# Patient Record
Sex: Female | Born: 1955 | Race: White | Hispanic: No | Marital: Married | Smoking: Never smoker
Health system: Southern US, Community
[De-identification: ages and names within clinical notes are randomized; demographics above are authoritative.]

## PROBLEM LIST (undated history)

## (undated) DIAGNOSIS — H269 Unspecified cataract: Secondary | ICD-10-CM

## (undated) DIAGNOSIS — M7542 Impingement syndrome of left shoulder: Secondary | ICD-10-CM

## (undated) DIAGNOSIS — K644 Residual hemorrhoidal skin tags: Secondary | ICD-10-CM

## (undated) DIAGNOSIS — H8110 Benign paroxysmal vertigo, unspecified ear: Secondary | ICD-10-CM

## (undated) HISTORY — DX: Benign paroxysmal vertigo, unspecified ear: H81.10

## (undated) HISTORY — PX: NO PAST SURGERIES: SHX2092

## (undated) HISTORY — DX: Unspecified cataract: H26.9

---
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
## 2016-08-29 ENCOUNTER — Ambulatory Visit (INDEPENDENT_AMBULATORY_CARE_PROVIDER_SITE_OTHER): Payer: Managed Care, Other (non HMO) | Admitting: Family Medicine

## 2016-08-29 VITALS — BP 122/80 | HR 70 | Temp 97.8°F | Resp 16 | Ht 66.0 in | Wt 137.0 lb

## 2016-08-29 DIAGNOSIS — Z1211 Encounter for screening for malignant neoplasm of colon: Secondary | ICD-10-CM | POA: Diagnosis not present

## 2016-08-29 DIAGNOSIS — K649 Unspecified hemorrhoids: Secondary | ICD-10-CM

## 2016-08-29 DIAGNOSIS — Z136 Encounter for screening for cardiovascular disorders: Secondary | ICD-10-CM

## 2016-08-29 DIAGNOSIS — Z124 Encounter for screening for malignant neoplasm of cervix: Secondary | ICD-10-CM

## 2016-08-29 DIAGNOSIS — Z0000 Encounter for general adult medical examination without abnormal findings: Secondary | ICD-10-CM | POA: Diagnosis not present

## 2016-08-29 DIAGNOSIS — Z1389 Encounter for screening for other disorder: Secondary | ICD-10-CM | POA: Diagnosis not present

## 2016-08-29 DIAGNOSIS — Z1383 Encounter for screening for respiratory disorder NEC: Secondary | ICD-10-CM | POA: Diagnosis not present

## 2016-08-29 DIAGNOSIS — M2550 Pain in unspecified joint: Secondary | ICD-10-CM | POA: Diagnosis not present

## 2016-08-29 DIAGNOSIS — Z1212 Encounter for screening for malignant neoplasm of rectum: Secondary | ICD-10-CM

## 2016-08-29 DIAGNOSIS — Z1329 Encounter for screening for other suspected endocrine disorder: Secondary | ICD-10-CM

## 2016-08-29 DIAGNOSIS — Z113 Encounter for screening for infections with a predominantly sexual mode of transmission: Secondary | ICD-10-CM

## 2016-08-29 DIAGNOSIS — Z130 Encounter for screening for diseases of the blood and blood-forming organs and certain disorders involving the immune mechanism: Secondary | ICD-10-CM | POA: Diagnosis not present

## 2016-08-29 LAB — POCT URINALYSIS DIP (MANUAL ENTRY)
BILIRUBIN UA: NEGATIVE
Bilirubin, UA: NEGATIVE
Glucose, UA: NEGATIVE
Leukocytes, UA: NEGATIVE
Nitrite, UA: NEGATIVE
PH UA: 7.5
PROTEIN UA: NEGATIVE
SPEC GRAV UA: 1.01
Urobilinogen, UA: 0.2

## 2016-08-29 LAB — COMPREHENSIVE METABOLIC PANEL
ALBUMIN: 4.5 g/dL (ref 3.6–5.1)
ALT: 16 U/L (ref 6–29)
AST: 22 U/L (ref 10–35)
Alkaline Phosphatase: 70 U/L (ref 33–130)
BUN: 9 mg/dL (ref 7–25)
CHLORIDE: 102 mmol/L (ref 98–110)
CO2: 29 mmol/L (ref 20–31)
CREATININE: 0.73 mg/dL (ref 0.50–0.99)
Calcium: 9.8 mg/dL (ref 8.6–10.4)
Glucose, Bld: 86 mg/dL (ref 65–99)
Potassium: 5.4 mmol/L — ABNORMAL HIGH (ref 3.5–5.3)
SODIUM: 142 mmol/L (ref 135–146)
Total Bilirubin: 0.6 mg/dL (ref 0.2–1.2)
Total Protein: 6.9 g/dL (ref 6.1–8.1)

## 2016-08-29 LAB — CBC
HCT: 44.2 % (ref 35.0–45.0)
HEMOGLOBIN: 14.8 g/dL (ref 11.7–15.5)
MCH: 30.7 pg (ref 27.0–33.0)
MCHC: 33.5 g/dL (ref 32.0–36.0)
MCV: 91.7 fL (ref 80.0–100.0)
MPV: 10 fL (ref 7.5–12.5)
PLATELETS: 232 10*3/uL (ref 140–400)
RBC: 4.82 MIL/uL (ref 3.80–5.10)
RDW: 13.6 % (ref 11.0–15.0)
WBC: 4.7 10*3/uL (ref 3.8–10.8)

## 2016-08-29 LAB — LIPID PANEL
CHOL/HDL RATIO: 2.9 ratio (ref <=5.0)
CHOLESTEROL: 208 mg/dL — AB (ref 125–200)
HDL: 72 mg/dL (ref >=46)
LDL Cholesterol: 119 mg/dL (ref <130)
Triglycerides: 83 mg/dL (ref <150)
VLDL: 17 mg/dL (ref <30)

## 2016-08-29 LAB — TSH: TSH: 1.73 mIU/L

## 2016-08-29 LAB — C-REACTIVE PROTEIN: CRP: 0.7 mg/L (ref <8.0)

## 2016-08-29 LAB — SEDIMENTATION RATE: Sed Rate: 4 mm/hr (ref 0–30)

## 2016-08-29 LAB — HEPATITIS C ANTIBODY: HCV Ab: NEGATIVE

## 2016-08-29 NOTE — Progress Notes (Addendum)
Subjective:  This chart was scribed for Shawna SorensonEva Zakk Borgen MD, by Veverly FellsHatice Demirci,scribe, at [facility].  This patient was seen in room 9 and the patient's care was started at 8:59 AM.   Chief Complaint  Patient presents with  . Annual Exam    CPE      Patient ID: Shawna Bass, female    DOB: 09-05-1956, 60 y.o.   MRN: [id number]  HPI HPI Comments: Shawna RubensteinLaurey Mittman is a 60 y.o. female who presents to the [facility] for an annual physical exam.  She denies any dietary restrictions. Patient walks for exercise.  Pt does not have a PCP and is considering establishing her on the recommendation of my mother.  I had the pleasure of meeting Ms. Carol's daughter Shawna Bass last mo when she came to see me for worsening atypical headaches and syncope.   Hemorrhoids: She is concerned regarding her hemorrhoids and would like information on what she should do without having to go through surgery. 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  Patient states that she was told by a different physican who she saw mos later for a second opinion (regarding her internal hemorrhoids) that she did not in fact have them.  She has been using Miralax intermittently for the last 10 years. She "rarely" has bleeding and pain with her bowel movements.  CRS: Patients last colonoscopy was 10 years ago completed by Dr. Rise Paganiniharles Parker in [place].    Breast Cancer Screen: She had a mammogram completed last month 07/2016 in [place] and is compliant with being screened annually.  She has no family history of breast cancer.   Cervical Cancer Screen: Patient had her last pap smear 5 years ago and had an abnormal one 30 years ago which was normal on recheck and never required colposcopy or biopsy per her knowledge. All have been normal since.  She denies having a hysterectomy.  Patient would not like an STD screening today as she is in a long-term  married monogamous relationship.    Bone Health: Patient alternates a Vitamin D (800 IU) supp with a calcium supplement daily and eats two yogurts daily.  She has no family history of osteoporosis.  Lt Knee: Patient was having intermittent left knee pain in the past and was [facility] and had an MRI two years ago which came back showing osteoarthritis that was "normal for her age".   Lt Shoulder: Three years ago, she started having left shoulder pain which worsened over the past year and then again a couple months ago after a fall on her back resulting in a lumbar hematoma. At that time, she had also injured her back which slowly healed over time.   [facility] where she had a steroid injection in her shoulder. Since then, she has slightly "felt better".  She states that she has been coping with her knee and shoulder and states that the pain is "not that bad".  She does have times where her pain increases while lifting her luggage's during travel.   Lt Hip: 4 months ago, she started having left hip pain which was worsening and kept her from walking with ease or going up the stairs.  She tried acupuncture treatments for all of her joint pains and "took it easy" with her activities which seemed to help. Patient states that she has been having these various joint pains "  mainly on the left side" but they always seem to resolve on their own.  She has been resistant to trying nsaids as does not want to take medication unless absolutely necessary but she also doesn't want to make the joint pain worse but not getting things appropriately treated.   Past Medical History:  Diagnosis Date  . Cataract     No current outpatient prescriptions on file prior to visit.   No current facility-administered medications on file prior to visit.     Allergies  Allergen Reactions  . Sulfur     No past surgical history on file. Family History  Problem Relation Age of Onset  .  Stroke Mother   . Heart disease Father    Social History   Social History  . Marital status: Married    Spouse name: N/A  . Number of children: N/A  . Years of education: N/A   Social History Main Topics  . Smoking status: Never Smoker  . Smokeless tobacco: Never Used  . Alcohol use 6.0 oz/week    10 Standard drinks or equivalent per week  . Drug use: No  . Sexual activity: Not Asked   Other Topics Concern  . None   Social History Narrative  . None   Depression screen [facility] 2/9 08/29/2016  Decreased Interest 0  Down, Depressed, Hopeless 0  PHQ - 2 Score 0     Review of Systems  Constitutional: Negative for chills and fever.  Eyes: Negative for pain, redness and itching.  Gastrointestinal: Negative for abdominal distention, abdominal pain, nausea and vomiting.  Musculoskeletal: Positive for arthralgias and myalgias. Negative for neck pain and neck stiffness.  All other systems reviewed and are negative.      Objective:   Physical Exam  Constitutional: She is oriented to person, place, and time. She appears well-developed and well-nourished. No distress.  HENT:  Head: Normocephalic and atraumatic.  Right Ear: Tympanic membrane and external ear normal.  Left Ear: Tympanic membrane and external ear normal.  Nose: Nose normal.  Mouth/Throat: Oropharynx is clear and moist. No oropharyngeal exudate.  Neck: Normal range of motion. No thyromegaly present.  Cardiovascular: Normal rate, regular rhythm, S1 normal, S2 normal and normal heart sounds.  Exam reveals no gallop and no friction rub.   No murmur heard. 2+ pedal pulses.   Pulmonary/Chest: Effort normal and breath sounds normal. No respiratory distress.  Good air movement.  Breast exam: normal exam, no axillary adenopathy.    Abdominal: Soft. Normal appearance and bowel sounds are normal. She exhibits no distension. There is no hepatosplenomegaly. There is no tenderness. There is no rebound, no guarding and no CVA  tenderness.  Genitourinary:  Genitourinary Comments: She had a tiny urethral cyst (2 mm), she had approximately 1 non inflames external hemorrhoid and 2 external hemorrhoidal tags. She had question of 1 to 2 internal hemorrhoids non inflames low grade, no prolapse around 9-12 o-clock.   Musculoskeletal:  No point tenderness over the cervical or thoracic spinous process. Shoulders are symmetrical.  Normal AC joint. No palpable spasms  No tenderness over the acromial bursa.  Tenderness over the mid deltoid.  Bilateral Hips: no tenderness over the lateral trochanter.  She has mild limitation in flexion and external rotation, moderation limitation in internal rotation   Lymphadenopathy:    She has no cervical adenopathy.  Neurological: She is alert and oriented to person, place, and time.  Skin: Skin is warm and dry.  Psychiatric: She has a normal  mood and affect. Her behavior is normal.   Vitals:   08/29/16 0832  BP: 122/80  Pulse: 70  Resp: 16  Temp: 97.8 F (36.6 C)  TempSrc: Oral  SpO2: 98%  Weight: 137 lb (62.1 kg)  Height: 5\' 6"  (1.676 m)          Assessment & Plan:   1. Annual physical exam   2. Screening for cardiovascular, respiratory, and genitourinary diseases - ASCVD risk 3.3%  3. Screening for cervical cancer - pap done today, did not do hpv testing as we will want to repeat this in 3 yrs when pt is 60 yo and no more needed after that if nml with neg hpv at that time.  4. Screening for colorectal cancer  5. Screening for deficiency anemia   6. Screening for thyroid disorder   7. Arthralgia, unspecified joint - Encouraged pt to up her calcium/vit D consumption.  Reviewed concern that limiting and decreasing her physical activity during OA flairs will likely be worse for her in the long run rather than taking a short burst of nsaids.  Encouraged pt to restart exercise and muscle strengthening and if occ nsaids are needed to facilitate that, she should be able to  toelrate them fine.  8. Routine screening for STI (sexually transmitted infection)   9.      Hemorrhoids - pt reassured that very small internal and external hemorrhoids, her current regimen of keeping her stool soft seems to be working beautifully and as long as she continues she should not expect to have any problems with this.  Orders Placed This Encounter  Procedures  . CBC  . Comprehensive metabolic panel    Order Specific Question:   Has the patient fasted?    Answer:   Yes  . TSH  . Lipid panel    Order Specific Question:   Has the patient fasted?    Answer:   Yes  . Hepatitis C Antibody  . Sedimentation Rate  . C-reactive protein  . POCT urinalysis dipstick    Meds ordered this encounter  Medications  . Multiple Vitamins-Minerals (MULTIVITAMIN WITH MINERALS) tablet    Sig: Take 1 tablet by mouth daily.  . Polyethylene Glycol 3350 (MIRALAX PO)    Sig: Take by mouth.  . Ascorbic Acid (VITAMIN C) 100 MG tablet    Sig: Take 100 mg by mouth daily.  . Cholecalciferol (VITAMIN D PO)    Sig: Take by mouth.    I personally performed the services described in this documentation, which was scribed in my presence. The recorded information has been reviewed and considered, and addended by me as needed.   Shawna Sorenson, M.D.  [facility] [address] [place], [place] [address] [phone] phone [phone] fax  08/29/16 4:43 PM

## 2016-08-29 NOTE — Patient Instructions (Signed)
Bone Health Bones protect organs, store calcium, and anchor muscles. Good health habits, such as eating nutritious foods and exercising regularly, are important for maintaining healthy bones. They can also help to prevent a condition that causes bones to lose density and become weak and brittle (osteoporosis). WHY IS BONE MASS IMPORTANT? Bone mass refers to the amount of bone tissue that you have. The higher your bone mass, the stronger your bones. An important step toward having healthy bones throughout life is to have strong and dense bones during childhood. A young adult who has a high bone mass is more likely to have a high bone mass later in life. Bone mass at its greatest it is called peak bone mass. A large decline in bone mass occurs in older adults. In women, it occurs about the time of menopause. During this time, it is important to practice good health habits, because if more bone is lost than what is replaced, the bones will become less healthy and more likely to break (fracture). If you find that you have a low bone mass, you may be able to prevent osteoporosis or further bone loss by changing your diet and lifestyle. HOW CAN I FIND OUT IF MY BONE MASS IS LOW? Bone mass can be measured with an X-ray test that is called a bone mineral density (BMD) test. This test is recommended for all women who are age 65 or older. It may also be recommended for men who are age 70 or older, or for people who are more likely to develop osteoporosis due to:  Having bones that break easily.  Having a long-term disease that weakens bones, such as kidney disease or rheumatoid arthritis.  Having menopause earlier than normal.  Taking medicine that weakens bones, such as steroids, thyroid hormones, or hormone treatment for breast cancer or prostate cancer.  Smoking.  Drinking three or more alcoholic drinks each day. WHAT ARE THE NUTRITIONAL RECOMMENDATIONS FOR HEALTHY BONES? To have healthy bones, you need  to get enough of the right minerals and vitamins. Most nutrition experts recommend getting these nutrients from the foods that you eat. Nutritional recommendations vary from person to person. Ask your health care provider what is healthy for you. Here are some general guidelines. Calcium Recommendations Calcium is the most important (essential) mineral for bone health. Most people can get enough calcium from their diet, but supplements may be recommended for people who are at risk for osteoporosis. Good sources of calcium include:  Dairy products, such as low-fat or nonfat milk, cheese, and yogurt.  Dark green leafy vegetables, such as bok choy and broccoli.  Calcium-fortified foods, such as orange juice, cereal, bread, soy beverages, and tofu products.  Nuts, such as almonds. Follow these recommended amounts for daily calcium intake:  Children, age 1-3: 700 mg.  Children, age 4-8: 1,000 mg.  Children, age 9-13: 1,300 mg.  Teens, age 14-18: 1,300 mg.  Adults, age 19-50: 1,000 mg.  Adults, age 51-70:  Men: 1,000 mg.  Women: 1,200 mg.  Adults, age 71 or older: 1,200 mg.  Pregnant and breastfeeding females:  Teens: 1,300 mg.  Adults: 1,000 mg. Vitamin D Recommendations Vitamin D is the most essential vitamin for bone health. It helps the body to absorb calcium. Sunlight stimulates the skin to make vitamin D, so be sure to get enough sunlight. If you live in a cold climate or you do not get outside often, your health care provider may recommend that you take vitamin D supplements. Good   sources of vitamin D in your diet include:  Egg yolks.  Saltwater fish.  Milk and cereal fortified with vitamin D. Follow these recommended amounts for daily vitamin D intake:  Children and teens, age 7-18: 600 international units.  Adults, age 350 or younger: 400-800 international units.  Adults, age 62 or older: 800-1,000 international units. Other Nutrients Other nutrients for bone  health include:  Phosphorus. This mineral is found in meat, poultry, dairy foods, nuts, and legumes. The recommended daily intake for adult men and adult women is 700 mg.  Magnesium. This mineral is found in seeds, nuts, dark green vegetables, and legumes. The recommended daily intake for adult men is 400-420 mg. For adult women, it is 310-320 mg.  Vitamin K. This vitamin is found in green leafy vegetables. The recommended daily intake is 120 mg for adult men and 90 mg for adult women. WHAT TYPE OF PHYSICAL ACTIVITY IS BEST FOR BUILDING AND MAINTAINING HEALTHY BONES? Weight-bearing and strength-building activities are important for building and maintaining peak bone mass. Weight-bearing activities cause muscles and bones to work against gravity. Strength-building activities increases muscle strength that supports bones. Weight-bearing and muscle-building activities include:  Walking and hiking.  Jogging and running.  Dancing.  Gym exercises.  Lifting weights.  Tennis and racquetball.  Climbing stairs.  Aerobics. Adults should get at least 30 minutes of moderate physical activity on most days. Children should get at least 60 minutes of moderate physical activity on most days. Ask your health care provide what type of exercise is best for you. WHERE CAN I FIND MORE INFORMATION? For more information, check out the following websites:  National Osteoporosis Foundation: http://burton-owens.org/http://nof.org/learn/basics  [employer] Institutes of Health: http://www.niams.http://www.johnson-fowler.biz/nih.gov/Health_Info/Bone/Bone_Health/bone_health_for_life.asp   This information is not intended to replace advice given to you by your health care provider. Make sure you discuss any questions you have with your health care provider.   Document Released: 01/13/2004 Document Revised: 03/09/2015 Document Reviewed: 10/28/2014 Elsevier Interactive Patient Education 2016 Elsevier Inc.  Joint Pain Joint pain, which is also called arthralgia, can  be caused by many things. Joint pain often goes away when you follow your health care provider's instructions for relieving pain at home. However, joint pain can also be caused by conditions that require further treatment. Common causes of joint pain include:  Bruising in the area of the joint.  Overuse of the joint.  Wear and tear on the joints that occur with aging (osteoarthritis).  Various other forms of arthritis.  A buildup of a crystal form of uric acid in the joint (gout).  Infections of the joint (septic arthritis) or of the bone (osteomyelitis). Your health care provider may recommend medicine to help with the pain. If your joint pain continues, additional tests may be needed to diagnose your condition. HOME CARE INSTRUCTIONS Watch your condition for any changes. Follow these instructions as directed to lessen the pain that you are feeling.  Take medicines only as directed by your health care provider.  Rest the affected area for as long as your health care provider says that you should. If directed to do so, raise the painful joint above the level of your heart while you are sitting or lying down.  Do not do things that cause or worsen pain.  If directed, apply ice to the painful area:  Put ice in a plastic bag.  Place a towel between your skin and the bag.  Leave the ice on for 20 minutes, 2-3 times per day.  Wear  an elastic bandage, splint, or sling as directed by your health care provider. Loosen the elastic bandage or splint if your fingers or toes become numb and tingle, or if they turn cold and blue.  Begin exercising or stretching the affected area as directed by your health care provider. Ask your health care provider what types of exercise are safe for you.  Keep all follow-up visits as directed by your health care provider. This is important. SEEK MEDICAL CARE IF:  Your pain increases, and medicine does not help.  Your joint pain does not improve within 3  days.  You have increased bruising or swelling.  You have a fever.  You lose 10 lb (4.5 kg) or more without trying. SEEK IMMEDIATE MEDICAL CARE IF:  You are not able to move the joint.  Your fingers or toes become numb or they turn cold and blue.   This information is not intended to replace advice given to you by your health care provider. Make sure you discuss any questions you have with your health care provider.   Document Released: 10/23/2005 Document Revised: 11/13/2014 Document Reviewed: 08/04/2014 Elsevier Interactive Patient Education [employer]

## 2016-08-30 LAB — PAP IG W/ RFLX HPV ASCU

## 2017-09-10 ENCOUNTER — Encounter: Payer: Self-pay | Admitting: Family Medicine

## 2017-09-10 ENCOUNTER — Ambulatory Visit (INDEPENDENT_AMBULATORY_CARE_PROVIDER_SITE_OTHER): Payer: Commercial Managed Care - PPO | Admitting: Family Medicine

## 2017-09-10 ENCOUNTER — Other Ambulatory Visit: Payer: Self-pay

## 2017-09-10 VITALS — BP 110/68 | HR 72 | Temp 98.4°F | Resp 16 | Ht 66.0 in | Wt 138.8 lb

## 2017-09-10 DIAGNOSIS — Z136 Encounter for screening for cardiovascular disorders: Secondary | ICD-10-CM

## 2017-09-10 DIAGNOSIS — Z1389 Encounter for screening for other disorder: Secondary | ICD-10-CM | POA: Diagnosis not present

## 2017-09-10 DIAGNOSIS — Z1231 Encounter for screening mammogram for malignant neoplasm of breast: Secondary | ICD-10-CM

## 2017-09-10 DIAGNOSIS — Z1329 Encounter for screening for other suspected endocrine disorder: Secondary | ICD-10-CM | POA: Diagnosis not present

## 2017-09-10 DIAGNOSIS — Z1383 Encounter for screening for respiratory disorder NEC: Secondary | ICD-10-CM

## 2017-09-10 DIAGNOSIS — Z0000 Encounter for general adult medical examination without abnormal findings: Secondary | ICD-10-CM

## 2017-09-10 DIAGNOSIS — Z130 Encounter for screening for diseases of the blood and blood-forming organs and certain disorders involving the immune mechanism: Secondary | ICD-10-CM

## 2017-09-10 DIAGNOSIS — Z113 Encounter for screening for infections with a predominantly sexual mode of transmission: Secondary | ICD-10-CM

## 2017-09-10 MED ORDER — ZOSTER VAC RECOMB ADJUVANTED 50 MCG/0.5ML IM SUSR: 0.5000 mL | Freq: Once | INTRAMUSCULAR | 1 refills | Status: AC

## 2017-09-10 NOTE — Progress Notes (Signed)
Subjective:    Patient ID: Shawna Bass, female    DOB: 03/21/1956, 61 y.o.   MRN: [id number] Chief Complaint  Patient presents with  . Annual Exam    HPI Primary Preventative Screenings: Cervical Cancer: No relevant abnml. Pap 2012 and 08/29/2016.  STI screening: Declines as in long-term married monogamous relationship, declines HIV.  Breast Cancer: [place]am 07[place] in [place] - compliant w/ annual screening, no FHx - and scheduled for next week. Colorectal Cancer:colonoscopy was 12 yrs ago completed by Dr. Rise Paganiniharles Parker in [place].  Tobacco use/EtOH/substances: Bone Density: alternates a Vitamin D (800 IU) supp with a calcium supplement daily and eats two yogurts daily.  She has no family history of osteoporosis.  Cardiac:ASCVD risk 3.3% Weight/Blood sugar/Diet/Exercise: walks for exercise - at least a mile, eats well, watches fiber, tries to get 4 servings of fruit/veggies BMI Readings from Last 3 Encounters:  08/29/16 22.11 kg/m   No results found for: HGBA1C OTC/Vit/Supp/Herbal: vit D, vit C, calcium, mvi. Dentist/Optho: optho 2 wks ago - [facility], dentist twice a years.. Immunizations: Did get flu shot last month.  She did get Zostavax around 2010  There is no immunization history on file for this patient. Did 30d on, 30d off, then 30d back on for li 3-4x on liver check alst yr  Dermatologist  -- [facility]imoto[facility] in [place] Dr. Darlina Sicilianim Lane   Chronic Medical Conditions: Hemorrhoids: Uses Miralax daily - going to schedule an appointment with Dr. Loreta AveMann.  OA: Lt knee pain intermittently - MRI 2015 at [facility], knees and hips  Past Medical History:  Diagnosis Date  . Cataract    History reviewed. No pertinent surgical history. Current Outpatient Medications on File Prior to Visit  Medication Sig Dispense Refill  . Ascorbic Acid (VITAMIN C) 100 MG tablet Take 100 mg by mouth daily.    . Cholecalciferol (VITAMIN D PO) Take by mouth.    .  Multiple Vitamins-Minerals (MULTIVITAMIN WITH MINERALS) tablet Take 1 tablet by mouth daily.    . Polyethylene Glycol 3350 (MIRALAX PO) Take by mouth.     No current facility-administered medications on file prior to visit.    Allergies  Allergen Reactions  . Sulfur    Family History  Problem Relation Age of Onset  . Stroke Mother   . Heart disease Father    Social History   Socioeconomic History  . Marital status: Married    Spouse name: None  . Number of children: None  . Years of education: None  . Highest education level: None  Social Needs  . Financial resource strain: None  . Food insecurity - worry: None  . Food insecurity - inability: None  . Transportation needs - medical: None  . Transportation needs - non-medical: None  Occupational History  . None  Tobacco Use  . Smoking status: Never Smoker  . Smokeless tobacco: Never Used  Substance and Sexual Activity  . Alcohol use: Yes    Alcohol/week: 6.0 oz    Types: 10 Standard drinks or equivalent per week  . Drug use: No  . Sexual activity: None  Other Topics Concern  . None  Social History Narrative  . None   Depression screen [facility] 2/9 09/10/2017 08/29/2016  Decreased Interest 0 0  Down, Depressed, Hopeless 0 0  PHQ - 2 Score 0 0     Review of Systems    see hpi Objective:   Physical Exam  Constitutional: She is oriented to person, place,  and time. She appears well-developed and well-nourished. No distress.  HENT:  Head: Normocephalic and atraumatic.  Right Ear: Tympanic membrane, external ear and ear canal normal.  Left Ear: Tympanic membrane, external ear and ear canal normal.  Nose: Nose normal. No mucosal edema or rhinorrhea.  Mouth/Throat: Uvula is midline, oropharynx is clear and moist and mucous membranes are normal. No posterior oropharyngeal erythema.  Eyes: Conjunctivae and EOM are normal. Pupils are equal, round, and reactive to light. Right eye exhibits no discharge. Left eye exhibits no  discharge. No scleral icterus.  Neck: Normal range of motion. Neck supple. No thyromegaly present.  Cardiovascular: Normal rate, regular rhythm, normal heart sounds and intact distal pulses.  Pulmonary/Chest: Effort normal and breath sounds normal. No respiratory distress.  Abdominal: Soft. Bowel sounds are normal. There is no tenderness.  Musculoskeletal: She exhibits no edema.  Lymphadenopathy:    She has no cervical adenopathy.  Neurological: She is alert and oriented to person, place, and time. She has normal reflexes.  Skin: Skin is warm and dry. She is not diaphoretic. No erythema.  Psychiatric: She has a normal mood and affect. Her behavior is normal.  BP 110/68   Pulse 72   Temp 98.4 F (36.9 C)   Resp 16   Ht 5\' 6"  (1.676 m)   Wt 138 lb 12.8 oz (63 kg)   SpO2 99%   BMI 22.40 kg/m      Assessment & Plan:  Has done research - has hemorrhoids and anal prolapse.  Dr. Darlina Sicilian referred her to GI - Dr. Loreta Ave.  1. Annual physical exam   2. Routine screening for STI (sexually transmitted infection)   3. Encounter for screening mammogram for breast cancer   4. Screening for cardiovascular, respiratory, and genitourinary diseases   5. Screening for deficiency anemia   6. Screening for thyroid disorder     Orders Placed This Encounter  Procedures  . CBC with Differential/Platelet  . Comprehensive metabolic panel    Order Specific Question:   Has the patient fasted?    Answer:   Yes  . Lipid panel    Order Specific Question:   Has the patient fasted?    Answer:   Yes  . TSH  . HIV antibody  . POCT urinalysis dipstick    Meds ordered this encounter  Medications  . Zoster Vaccine Adjuvanted [facility]) injection    Sig: Inject 0.5 mLs once for 1 dose into the muscle. Repeat once in 2-6 months    Dispense:  0.5 mL    Refill:  1    Norberto Sorenson, M.D.  Primary Care at [facility] [address] [place], [place] [address] [phone] phone [phone]  fax  09/13/17 1:38 PM

## 2017-09-10 NOTE — Patient Instructions (Addendum)
   IF you received an x-ray today, you will receive an invoice from [facility]. Please contact [facility] at [phone] with questions or concerns regarding your invoice.   IF you received labwork today, you will receive an invoice from [facility]. Please contact [facility] at [phone] with questions or concerns regarding your invoice.   Our billing staff will not be able to assist you with questions regarding bills from these companies.  You will be contacted with the lab results as soon as they are available. The fastest way to get your results is to activate your My Chart account. Instructions are located on the last page of this paperwork. If you have not heard from us regarding the results in 2 weeks, please contact this office.     Health Maintenance for Postmenopausal Women Menopause is a normal process in which your reproductive ability comes to an end. This process happens gradually over a span of months to years, usually between the ages of 48 and 55. Menopause is complete when you have missed 12 consecutive menstrual periods. It is important to talk with your health care provider about some of the most common conditions that affect postmenopausal women, such as heart disease, cancer, and bone loss (osteoporosis). Adopting a healthy lifestyle and getting preventive care can help to promote your health and wellness. Those actions can also lower your chances of developing some of these common conditions. What should I know about menopause? During menopause, you may experience a number of symptoms, such as:  Moderate-to-severe hot flashes.  Night sweats.  Decrease in sex drive.  Mood swings.  Headaches.  Tiredness.  Irritability.  Memory problems.  Insomnia.  Choosing to treat or not to treat menopausal changes is an individual decision that you make with your health care provider. What should I know about hormone replacement therapy and  supplements? Hormone therapy products are effective for treating symptoms that are associated with menopause, such as hot flashes and night sweats. Hormone replacement carries certain risks, especially as you become older. If you are thinking about using estrogen or estrogen with progestin treatments, discuss the benefits and risks with your health care provider. What should I know about heart disease and stroke? Heart disease, heart attack, and stroke become more likely as you age. This may be due, in part, to the hormonal changes that your body experiences during menopause. These can affect how your body processes dietary fats, triglycerides, and cholesterol. Heart attack and stroke are both medical emergencies. There are many things that you can do to help prevent heart disease and stroke:  Have your blood pressure checked at least every 1-2 years. High blood pressure causes heart disease and increases the risk of stroke.  If you are 55-79 years old, ask your health care provider if you should take aspirin to prevent a heart attack or a stroke.  Do not use any tobacco products, including cigarettes, chewing tobacco, or electronic cigarettes. If you need help quitting, ask your health care provider.  It is important to eat a healthy diet and maintain a healthy weight. ? Be sure to include plenty of vegetables, fruits, low-fat dairy products, and lean protein. ? Avoid eating foods that are high in solid fats, added sugars, or salt (sodium).  Get regular exercise. This is one of the most important things that you can do for your health. ? Try to exercise for at least 150 minutes each week. The type of exercise that you do should increase your   your heart rate and make you sweat. This is known as moderate-intensity exercise. ? Try to do strengthening exercises at least twice each week. Do these in addition to the moderate-intensity exercise.  Know your numbers.Ask your health care provider to check  your cholesterol and your blood glucose. Continue to have your blood tested as directed by your health care provider.  What should I know about cancer screening? There are several types of cancer. Take the following steps to reduce your risk and to catch any cancer development as early as possible. Breast Cancer  Practice breast self-awareness. ? This means understanding how your breasts normally appear and feel. ? It also means doing regular breast self-exams. Let your health care provider know about any changes, no matter how small.  If you are 42 or older, have a clinician do a breast exam (clinical breast exam or CBE) every year. Depending on your age, family history, and medical history, it may be recommended that you also have a yearly breast X-ray (mammogram).  If you have a family history of breast cancer, talk with your health care provider about genetic screening.  If you are at high risk for breast cancer, talk with your health care provider about having an MRI and a mammogram every year.  Breast cancer (BRCA) gene test is recommended for women who have family members with BRCA-related cancers. Results of the assessment will determine the need for genetic counseling and BRCA1 and for BRCA2 testing. BRCA-related cancers include these types: ? Breast. This occurs in males or females. ? Ovarian. ? Tubal. This may also be called fallopian tube cancer. ? Cancer of the abdominal or pelvic lining (peritoneal cancer). ? Prostate. ? Pancreatic.  Cervical, Uterine, and Ovarian Cancer Your health care provider may recommend that you be screened regularly for cancer of the pelvic organs. These include your ovaries, uterus, and vagina. This screening involves a pelvic exam, which includes checking for microscopic changes to the surface of your cervix (Pap test).  For women ages 21-65, health care providers may recommend a pelvic exam and a Pap test every three years. For women ages 77-65,  they may recommend the Pap test and pelvic exam, combined with testing for human papilloma virus (HPV), every five years. Some types of HPV increase your risk of cervical cancer. Testing for HPV may also be done on women of any age who have unclear Pap test results.  Other health care providers may not recommend any screening for nonpregnant women who are considered low risk for pelvic cancer and have no symptoms. Ask your health care provider if a screening pelvic exam is right for you.  If you have had past treatment for cervical cancer or a condition that could lead to cancer, you need Pap tests and screening for cancer for at least 20 years after your treatment. If Pap tests have been discontinued for you, your risk factors (such as having a new sexual partner) need to be reassessed to determine if you should start having screenings again. Some women have medical problems that increase the chance of getting cervical cancer. In these cases, your health care provider may recommend that you have screening and Pap tests more often.  If you have a family history of uterine cancer or ovarian cancer, talk with your health care provider about genetic screening.  If you have vaginal bleeding after reaching menopause, tell your health care provider.  There are currently no reliable tests available to screen for ovarian cancer.  Lung Cancer Lung cancer screening is recommended for adults 14-22 years old who are at high risk for lung cancer because of a history of smoking. A yearly low-dose CT scan of the lungs is recommended if you:  Currently smoke.  Have a history of at least 30 pack-years of smoking and you currently smoke or have quit within the past 15 years. A pack-year is smoking an average of one pack of cigarettes per day for one year.  Yearly screening should:  Continue until it has been 15 years since you quit.  Stop if you develop a health problem that would prevent you from having lung  cancer treatment.  Colorectal Cancer  This type of cancer can be detected and can often be prevented.  Routine colorectal cancer screening usually begins at age 76 and continues through age 78.  If you have risk factors for colon cancer, your health care provider may recommend that you be screened at an earlier age.  If you have a family history of colorectal cancer, talk with your health care provider about genetic screening.  Your health care provider may also recommend using home test kits to check for hidden blood in your stool.  A small camera at the end of a tube can be used to examine your colon directly (sigmoidoscopy or colonoscopy). This is done to check for the earliest forms of colorectal cancer.  Direct examination of the colon should be repeated every 5-10 years until age 63. However, if early forms of precancerous polyps or small growths are found or if you have a family history or genetic risk for colorectal cancer, you may need to be screened more often.  Skin Cancer  Check your skin from head to toe regularly.  Monitor any moles. Be sure to tell your health care provider: ? About any new moles or changes in moles, especially if there is a change in a mole's shape or color. ? If you have a mole that is larger than the size of a pencil eraser.  If any of your family members has a history of skin cancer, especially at a young age, talk with your health care provider about genetic screening.  Always use sunscreen. Apply sunscreen liberally and repeatedly throughout the day.  Whenever you are outside, protect yourself by wearing long sleeves, pants, a wide-brimmed hat, and sunglasses.  What should I know about osteoporosis? Osteoporosis is a condition in which bone destruction happens more quickly than new bone creation. After menopause, you may be at an increased risk for osteoporosis. To help prevent osteoporosis or the bone fractures that can happen because of  osteoporosis, the following is recommended:  If you are 64-4 years old, get at least 1,000 mg of calcium and at least 600 mg of vitamin D per day.  If you are older than age 56 but younger than age 26, get at least 1,200 mg of calcium and at least 600 mg of vitamin D per day.  If you are older than age 1, get at least 1,200 mg of calcium and at least 800 mg of vitamin D per day.  Smoking and excessive alcohol intake increase the risk of osteoporosis. Eat foods that are rich in calcium and vitamin D, and do weight-bearing exercises several times each week as directed by your health care provider. What should I know about how menopause affects my mental health? Depression may occur at any age, but it is more common as you become older. Common symptoms of  depression include:  Low or sad mood.  Changes in sleep patterns.  Changes in appetite or eating patterns.  Feeling an overall lack of motivation or enjoyment of activities that you previously enjoyed.  Frequent crying spells.  Talk with your health care provider if you think that you are experiencing depression. What should I know about immunizations? It is important that you get and maintain your immunizations. These include:  Tetanus, diphtheria, and pertussis (Tdap) booster vaccine.  Influenza every year before the flu season begins.  Pneumonia vaccine.  Shingles vaccine.  Your health care provider may also recommend other immunizations. This information is not intended to replace advice given to you by your health care provider. Make sure you discuss any questions you have with your health care provider. Document Released: 12/15/2005 Document Revised: 05/12/2016 Document Reviewed: 07/27/2015 Elsevier Interactive Patient Education  2018 [place] protect organs, store calcium, and anchor muscles. Good health habits, such as eating nutritious foods and exercising regularly, are important for  maintaining healthy bones. They can also help to prevent a condition that causes bones to lose density and become weak and brittle (osteoporosis). Why is bone mass important? Bone mass refers to the amount of bone tissue that you have. The higher your bone mass, the stronger your bones. An important step toward having healthy bones throughout life is to have strong and dense bones during childhood. A young adult who has a high bone mass is more likely to have a high bone mass later in life. Bone mass at its greatest it is called peak bone mass. A large decline in bone mass occurs in older adults. In women, it occurs about the time of menopause. During this time, it is important to practice good health habits, because if more bone is lost than what is replaced, the bones will become less healthy and more likely to break (fracture). If you find that you have a low bone mass, you may be able to prevent osteoporosis or further bone loss by changing your diet and lifestyle. How can I find out if my bone mass is low? Bone mass can be measured with an X-ray test that is called a bone mineral density (BMD) test. This test is recommended for all women who are age 59 or older. It may also be recommended for men who are age 65 or older, or for people who are more likely to develop osteoporosis due to:  Having bones that break easily.  Having a long-term disease that weakens bones, such as kidney disease or rheumatoid arthritis.  Having menopause earlier than normal.  Taking medicine that weakens bones, such as steroids, thyroid hormones, or hormone treatment for breast cancer or prostate cancer.  Smoking.  Drinking three or more alcoholic drinks each day.  What are the nutritional recommendations for healthy bones? To have healthy bones, you need to get enough of the right minerals and vitamins. Most nutrition experts recommend getting these nutrients from the foods that you eat. Nutritional recommendations  vary from person to person. Ask your health care provider what is healthy for you. Here are some general guidelines. Calcium Recommendations Calcium is the most important (essential) mineral for bone health. Most people can get enough calcium from their diet, but supplements may be recommended for people who are at risk for osteoporosis. Good sources of calcium include:  Dairy products, such as low-fat or nonfat milk, cheese, and yogurt.  Dark green leafy vegetables, such as [employer] and  broccoli.  Calcium-fortified foods, such as orange juice, cereal, bread, soy beverages, and tofu products.  Nuts, such as almonds.  Follow these recommended amounts for daily calcium intake:  Children, age 67?3: 700 mg.  Children, age 12?8: 1,000 mg.  Children, age 29?13: 1,300 mg.  Teens, age 87?18: 1,300 mg.  Adults, age 35?50: 1,000 mg.  Adults, age 68?70: ? Men: 1,000 mg. ? Women: 1,200 mg.  Adults, age 68 or older: 1,200 mg.  Pregnant and breastfeeding females: ? Teens: 1,300 mg. ? Adults: 1,000 mg.  Vitamin D Recommendations Vitamin D is the most essential vitamin for bone health. It helps the body to absorb calcium. Sunlight stimulates the skin to make vitamin D, so be sure to get enough sunlight. If you live in a cold climate or you do not get outside often, your health care provider may recommend that you take vitamin D supplements. Good sources of vitamin D in your diet include:  Egg yolks.  Saltwater fish.  Milk and cereal fortified with vitamin D.  Follow these recommended amounts for daily vitamin D intake:  Children and teens, age 35?18: 64 international units.  Adults, age 82 or younger: 400-800 international units.  Adults, age 69 or older: 800-1,000 international units.  Other Nutrients Other nutrients for bone health include:  Phosphorus. This mineral is found in meat, poultry, dairy foods, nuts, and legumes. The recommended daily intake for adult men and adult  women is 700 mg.  Magnesium. This mineral is found in seeds, nuts, dark green vegetables, and legumes. The recommended daily intake for adult men is 400?420 mg. For adult women, it is 310?320 mg.  Vitamin K. This vitamin is found in green leafy vegetables. The recommended daily intake is 120 mg for adult men and 90 mg for adult women.  What type of physical activity is best for building and maintaining healthy bones? Weight-bearing and strength-building activities are important for building and maintaining peak bone mass. Weight-bearing activities cause muscles and bones to work against gravity. Strength-building activities increases muscle strength that supports bones. Weight-bearing and muscle-building activities include:  Walking and hiking.  Jogging and running.  Dancing.  Gym exercises.  Lifting weights.  Tennis and racquetball.  Climbing stairs.  Aerobics.  Adults should get at least 30 minutes of moderate physical activity on most days. Children should get at least 60 minutes of moderate physical activity on most days. Ask your health care provide what type of exercise is best for you. Where can I find more information? For more information, check out the following websites:  [place]: YardHomes.se  [employer] of Health: http://www.niams.AnonymousEar.fr.asp  This information is not intended to replace advice given to you by your health care provider. Make sure you discuss any questions you have with your health care provider. Document Released: 01/13/2004 Document Revised: 05/12/2016 Document Reviewed: 10/28/2014 Elsevier Interactive Patient Education  2018 [employer].  Preventing Osteoporosis, Adult Osteoporosis is a condition that causes the bones to get weaker. With osteoporosis, the bones become thinner, and the normal spaces in bone tissue become larger. This can make the  bones weak and cause them to break more easily. People who have osteoporosis are more likely to break their wrist, spine, or hip. Even a minor accident or injury can be enough to break weak bones. Osteoporosis can occur with aging. Your body constantly replaces old bone tissue with new tissue. As you get older, you may lose bone tissue more quickly, or it may be replaced  more slowly. Osteoporosis is more likely to develop if you have poor nutrition or do not get enough calcium or vitamin D. Other lifestyle factors can also play a role. By making some diet and lifestyle changes, you can help to keep your bones healthy and help to prevent osteoporosis. What nutrition changes can be made? Nutrition plays an important role in maintaining healthy, strong bones.  Make sure you get enough calcium every day from food or from calcium supplements. ? If you are age 6 or younger, aim to get 1,000 mg of calcium every day. ? If you are older than age 79, aim to get 1,200 mg of calcium every day.  Try to get enough vitamin D every day. ? If you are age 22 or younger, aim to get 600 international units (IU) every day. ? If you are older than age 22, aim to get 800 international units every day.  Follow a healthy diet. Eat plenty of foods that contain calcium and vitamin D. ? Calcium is in milk, cheese, yogurt, and other dairy products. Some fish and vegetables are also good sources of calcium. Many foods such as cereals and breads have had calcium added to them (are fortified). Check nutrition labels to see how much calcium is in a food or drink. ? Foods that contain vitamin D include milk, cereals, salmon, and tuna. Your body also makes vitamin D when you are out in the sun. Bare skin exposure to the sun on your face, arms, legs, or back for no more than 30 minutes a day, 2 times per week is more than enough. Beyond that, it is important to use sunblock to protect your skin from sunburn, which increases your risk  for skin cancer.  What lifestyle changes can be made? Making changes in your everyday life can also play an important role in preventing osteoporosis.  Stay active and get exercise every day. Ask your health care provider what types of exercise are best for you.  Do not use any products that contain nicotine or tobacco, such as cigarettes and e-cigarettes. If you need help quitting, ask your health care provider.  Limit alcohol intake to no more than 1 drink a day for nonpregnant women and 2 drinks a day for men. One drink equals 12 oz of beer, 5 oz of wine, or 1 oz of hard liquor.  Why are these changes important? Making these nutrition and lifestyle changes can:  Help you develop and maintain healthy, strong bones.  Prevent loss of bone mass and the problems that are caused by that loss, such as broken bones and delayed healing.  Make you feel better mentally and physically.  What can happen if changes are not made? Problems that can result from osteoporosis can be very serious. These may include:  A higher risk of broken bones that are painful and do not heal well.  Physical malformations, such as a collapsed spine or a hunched back.  Problems with movement.  Where to find support: If you need help making changes to prevent osteoporosis, talk with your health care provider. You can ask for a referral to a diet and nutrition specialist (dietitian) and a physical therapist. Where to find more information: Learn more about osteoporosis from:  NIH Osteoporosis and Related [place]: www.niams.GolfingGoddess.com.br  U.S. Office on Women's Health: SouvenirBaseball.es.html  National Osteoporosis Foundation: ProfilePeek.ch  Summary  Osteoporosis is a condition that causes weak bones that are more likely to break.  Eating  a healthy  diet and making sure you get enough calcium and vitamin D can help prevent osteoporosis.  Other ways to reduce your risk of osteoporosis include getting regular exercise and avoiding alcohol and products that contain nicotine or tobacco. This information is not intended to replace advice given to you by your health care provider. Make sure you discuss any questions you have with your health care provider. Document Released: 11/07/2015 Document Revised: 07/03/2016 Document Reviewed: 07/03/2016 Elsevier Interactive Patient Education  [employer].

## 2017-09-11 LAB — LIPID PANEL
CHOL/HDL RATIO: 2.6 ratio (ref 0.0–4.4)
CHOLESTEROL TOTAL: 195 mg/dL (ref 100–199)
HDL: 75 mg/dL (ref >39)
LDL Calculated: 104 mg/dL — ABNORMAL HIGH (ref 0–99)
Triglycerides: 80 mg/dL (ref 0–149)
VLDL CHOLESTEROL CAL: 16 mg/dL (ref 5–40)

## 2017-09-11 LAB — COMPREHENSIVE METABOLIC PANEL
ALT: 16 IU/L (ref 0–32)
AST: 28 IU/L (ref 0–40)
Albumin/Globulin Ratio: 1.9 (ref 1.2–2.2)
Albumin: 4.6 g/dL (ref 3.6–4.8)
Alkaline Phosphatase: 80 IU/L (ref 39–117)
BUN/Creatinine Ratio: 11 — ABNORMAL LOW (ref 12–28)
BUN: 8 mg/dL (ref 8–27)
Bilirubin Total: 0.5 mg/dL (ref 0.0–1.2)
CALCIUM: 9.4 mg/dL (ref 8.7–10.3)
CO2: 24 mmol/L (ref 20–29)
Chloride: 99 mmol/L (ref 96–106)
Creatinine, Ser: 0.7 mg/dL (ref 0.57–1.00)
GFR calc non Af Amer: 93 mL/min/{1.73_m2} (ref >59)
GFR, EST AFRICAN AMERICAN: 107 mL/min/{1.73_m2} (ref >59)
GLUCOSE: 88 mg/dL (ref 65–99)
Globulin, Total: 2.4 g/dL (ref 1.5–4.5)
Potassium: 4.1 mmol/L (ref 3.5–5.2)
SODIUM: 141 mmol/L (ref 134–144)
Total Protein: 7 g/dL (ref 6.0–8.5)

## 2017-09-11 LAB — CBC WITH DIFFERENTIAL/PLATELET
BASOS ABS: 0.1 10*3/uL (ref 0.0–0.2)
Basos: 1 %
EOS (ABSOLUTE): 0.2 10*3/uL (ref 0.0–0.4)
EOS: 3 %
HEMATOCRIT: 42.7 % (ref 34.0–46.6)
Hemoglobin: 13.8 g/dL (ref 11.1–15.9)
IMMATURE GRANULOCYTES: 0 %
Immature Grans (Abs): 0 10*3/uL (ref 0.0–0.1)
Lymphocytes Absolute: 1.8 10*3/uL (ref 0.7–3.1)
Lymphs: 36 %
MCH: 29.9 pg (ref 26.6–33.0)
MCHC: 32.3 g/dL (ref 31.5–35.7)
MCV: 92 fL (ref 79–97)
MONOS ABS: 0.4 10*3/uL (ref 0.1–0.9)
Monocytes: 9 %
NEUTROS PCT: 51 %
Neutrophils Absolute: 2.5 10*3/uL (ref 1.4–7.0)
PLATELETS: 239 10*3/uL (ref 150–379)
RBC: 4.62 x10E6/uL (ref 3.77–5.28)
RDW: 13.5 % (ref 12.3–15.4)
WBC: 4.9 10*3/uL (ref 3.4–10.8)

## 2017-09-11 LAB — TSH: TSH: 1.67 u[IU]/mL (ref 0.450–4.500)

## 2017-09-11 LAB — HIV ANTIBODY (ROUTINE TESTING W REFLEX): HIV Screen 4th Generation wRfx: NONREACTIVE

## 2017-12-05 LAB — HM COLONOSCOPY

## 2018-05-30 ENCOUNTER — Encounter: Payer: Self-pay | Admitting: *Deleted

## 2018-10-16 ENCOUNTER — Telehealth: Payer: Self-pay | Admitting: Family Medicine

## 2018-10-16 NOTE — Telephone Encounter (Signed)
Please advise: I dont see a mm order in for pt.   Copied from CRM [id number]. Topic: Appointment Scheduling - Scheduling Inquiry for Clinic >> Oct 16, 2018  9:53 AM Jaquita Rectoravis, Karen A wrote: Reason for CRM: Patient called to have Dr Leandro ReasonerShaws nurse call to schedule an appointment for her Mammogram. [facility]  Radiology  Ph# [phone]  Fax# [phone] Patient is awaiting a call back when scheduled please. Ph# [phone]

## 2018-10-19 ENCOUNTER — Encounter: Payer: Self-pay | Admitting: *Deleted

## 2018-10-19 NOTE — Telephone Encounter (Signed)
Spoke with patient sent mammogram information via [place] chart [place]. Patient was going to call [facility] to see if she can book herself.

## 2018-11-15 ENCOUNTER — Other Ambulatory Visit: Payer: Self-pay | Admitting: Family Medicine

## 2018-11-15 DIAGNOSIS — Z1231 Encounter for screening mammogram for malignant neoplasm of breast: Secondary | ICD-10-CM

## 2018-12-17 ENCOUNTER — Ambulatory Visit
Admission: RE | Admit: 2018-12-17 | Discharge: 2018-12-17 | Disposition: A | Discharge status: Home / Self Care | Payer: Commercial Managed Care - PPO | Source: Ambulatory Visit | Attending: Family Medicine | Admitting: Family Medicine

## 2018-12-17 DIAGNOSIS — Z1231 Encounter for screening mammogram for malignant neoplasm of breast: Secondary | ICD-10-CM

## 2018-12-19 NOTE — Telephone Encounter (Signed)
Imaging was done

## 2019-02-20 ENCOUNTER — Other Ambulatory Visit: Payer: Self-pay | Admitting: *Deleted

## 2019-04-02 ENCOUNTER — Telehealth: Payer: Self-pay | Admitting: Family Medicine

## 2019-04-02 NOTE — Telephone Encounter (Signed)
See note  Copied from CRM [id number]. Topic: General - Inquiry >> Apr 02, 2019 10:51 AM Lorrine Kin, NT wrote: Reason for CRM: Patient calling and would like to know if the office had received her records from Primary Care at [facility]? Please advise.

## 2019-04-03 ENCOUNTER — Encounter: Payer: Self-pay | Admitting: *Deleted

## 2019-04-03 NOTE — Telephone Encounter (Signed)
Sent a mychart message to the patient. That we can see CPE and labs from 2018 and 2017

## 2019-04-22 ENCOUNTER — Other Ambulatory Visit: Payer: Self-pay

## 2019-04-22 ENCOUNTER — Encounter: Payer: Self-pay | Admitting: Family Medicine

## 2019-04-22 ENCOUNTER — Ambulatory Visit (INDEPENDENT_AMBULATORY_CARE_PROVIDER_SITE_OTHER): Payer: Commercial Managed Care - PPO | Admitting: Family Medicine

## 2019-04-22 VITALS — BP 142/86 | HR 72 | Temp 98.2°F | Resp 14 | Ht 65.0 in | Wt 138.8 lb

## 2019-04-22 DIAGNOSIS — M7542 Impingement syndrome of left shoulder: Secondary | ICD-10-CM | POA: Diagnosis not present

## 2019-04-22 DIAGNOSIS — Z0000 Encounter for general adult medical examination without abnormal findings: Secondary | ICD-10-CM

## 2019-04-22 DIAGNOSIS — R030 Elevated blood-pressure reading, without diagnosis of hypertension: Secondary | ICD-10-CM

## 2019-04-22 DIAGNOSIS — K644 Residual hemorrhoidal skin tags: Secondary | ICD-10-CM

## 2019-04-22 DIAGNOSIS — R42 Dizziness and giddiness: Secondary | ICD-10-CM

## 2019-04-22 HISTORY — DX: Impingement syndrome of left shoulder: M75.42

## 2019-04-22 HISTORY — DX: Residual hemorrhoidal skin tags: K64.4

## 2019-04-22 LAB — CBC WITH DIFFERENTIAL/PLATELET
Basophils Absolute: 0 10*3/uL (ref 0.0–0.1)
Basophils Relative: 0.9 % (ref 0.0–3.0)
Eosinophils Absolute: 0.1 10*3/uL (ref 0.0–0.7)
Eosinophils Relative: 1.6 % (ref 0.0–5.0)
HCT: 44.8 % (ref 36.0–46.0)
Hemoglobin: 14.8 g/dL (ref 12.0–15.0)
Lymphocytes Relative: 37.8 % (ref 12.0–46.0)
Lymphs Abs: 1.9 10*3/uL (ref 0.7–4.0)
MCHC: 33 g/dL (ref 30.0–36.0)
MCV: 94.3 fl (ref 78.0–100.0)
Monocytes Absolute: 0.4 10*3/uL (ref 0.1–1.0)
Monocytes Relative: 7 % (ref 3.0–12.0)
Neutro Abs: 2.7 10*3/uL (ref 1.4–7.7)
Neutrophils Relative %: 52.7 % (ref 43.0–77.0)
Platelets: 250 10*3/uL (ref 150.0–400.0)
RBC: 4.75 Mil/uL (ref 3.87–5.11)
RDW: 13.8 % (ref 11.5–15.5)
WBC: 5 10*3/uL (ref 4.0–10.5)

## 2019-04-22 LAB — LIPID PANEL
Cholesterol: 208 mg/dL — ABNORMAL HIGH (ref 0–200)
HDL: 75.7 mg/dL (ref >39.00)
LDL Cholesterol: 105 mg/dL — ABNORMAL HIGH (ref 0–99)
NonHDL: 131.92
Total CHOL/HDL Ratio: 3
Triglycerides: 134 mg/dL (ref 0.0–149.0)
VLDL: 26.8 mg/dL (ref 0.0–40.0)

## 2019-04-22 LAB — TSH: TSH: 2.51 u[IU]/mL (ref 0.35–4.50)

## 2019-04-22 LAB — COMPREHENSIVE METABOLIC PANEL
ALT: 13 U/L (ref 0–35)
AST: 20 U/L (ref 0–37)
Albumin: 4.6 g/dL (ref 3.5–5.2)
Alkaline Phosphatase: 75 U/L (ref 39–117)
BUN: 10 mg/dL (ref 6–23)
CO2: 32 mEq/L (ref 19–32)
Calcium: 9.6 mg/dL (ref 8.4–10.5)
Chloride: 101 mEq/L (ref 96–112)
Creatinine, Ser: 0.65 mg/dL (ref 0.40–1.20)
GFR: 91.56 mL/min (ref >60.00)
Glucose, Bld: 84 mg/dL (ref 70–99)
Potassium: 4.2 mEq/L (ref 3.5–5.1)
Sodium: 141 mEq/L (ref 135–145)
Total Bilirubin: 0.7 mg/dL (ref 0.2–1.2)
Total Protein: 7 g/dL (ref 6.0–8.3)

## 2019-04-22 NOTE — Patient Instructions (Signed)
Please return in October 2020 for a pap smear.  Your blood pressure was just above normal today.  Normal blood pressure is 120-130/70-80; borderline high blood pressure is 131-139/81-89, and high blood pressure is any reading > 140/90.  Please check your blood pressures at home or [facility] and log the readings. Please schedule an office visit if the remain elevated.   It was a pleasure meeting you today! Thank you for choosing [place] to meet your healthcare needs! I truly look forward to working with you. If you have any questions or concerns, please send me a message via Mychart or call the office at [phone].   Preventive Care 40-64 Years, Female Preventive care refers to lifestyle choices and visits with your health care provider that can promote health and wellness. What does preventive care include?   A yearly physical exam. This is also called an annual well check.  Dental exams once or twice a year.  Routine eye exams. Ask your health care provider how often you should have your eyes checked.  Personal lifestyle choices, including: ? Daily care of your teeth and gums. ? Regular physical activity. ? Eating a healthy diet. ? Avoiding tobacco and drug use. ? Limiting alcohol use. ? Practicing safe sex. ? Taking low-dose aspirin daily starting at age 67. ? Taking vitamin and mineral supplements as recommended by your health care provider. What happens during an annual well check? The services and screenings done by your health care provider during your annual well check will depend on your age, overall health, lifestyle risk factors, and family history of disease. Counseling Your health care provider may ask you questions about your:  Alcohol use.  Tobacco use.  Drug use.  Emotional well-being.  Home and relationship well-being.  Sexual activity.  Eating habits.  Work and work Statistician.  Method of birth control.  Menstrual cycle.  Pregnancy history. Screening  You may have the following tests or measurements:  Height, weight, and BMI.  Blood pressure.  Lipid and cholesterol levels. These may be checked every 5 years, or more frequently if you are over 37 years old.  Skin check.  Lung cancer screening. You may have this screening every year starting at age 62 if you have a 30-pack-year history of smoking and currently smoke or have quit within the past 15 years.  Colorectal cancer screening. All adults should have this screening starting at age 71 and continuing until age 85. Your health care provider may recommend screening at age 65. You will have tests every 1-10 years, depending on your results and the type of screening test. People at increased risk should start screening at an earlier age. Screening tests may include: ? Guaiac-based fecal occult blood testing. ? Fecal immunochemical test (FIT). ? Stool DNA test. ? Virtual colonoscopy. ? Sigmoidoscopy. During this test, a flexible tube with a tiny camera (sigmoidoscope) is used to examine your rectum and lower colon. The sigmoidoscope is inserted through your anus into your rectum and lower colon. ? Colonoscopy. During this test, a long, thin, flexible tube with a tiny camera (colonoscope) is used to examine your entire colon and rectum.  Hepatitis C blood test.  Hepatitis B blood test.  Sexually transmitted disease (STD) testing.  Diabetes screening. This is done by checking your blood sugar (glucose) after you have not eaten for a while (fasting). You may have this done every 1-3 years.  Mammogram. This may be done every 1-2 years. Talk to your health care provider about  when you should start having regular mammograms. This may depend on whether you have a family history of breast cancer.  BRCA-related cancer screening. This may be done if you have a family history of breast, ovarian, tubal, or peritoneal cancers.  Pelvic exam and Pap test. This may be done every 3 years starting at  age 42. Starting at age 69, this may be done every 5 years if you have a Pap test in combination with an HPV test.  Bone density scan. This is done to screen for osteoporosis. You may have this scan if you are at high risk for osteoporosis. Discuss your test results, treatment options, and if necessary, the need for more tests with your health care provider. Vaccines Your health care provider may recommend certain vaccines, such as:  Influenza vaccine. This is recommended every year.  Tetanus, diphtheria, and acellular pertussis (Tdap, Td) vaccine. You may need a Td booster every 10 years.  Varicella vaccine. You may need this if you have not been vaccinated.  Zoster vaccine. You may need this after age 42.  Measles, mumps, and rubella (MMR) vaccine. You may need at least one dose of MMR if you were born in 1957 or later. You may also need a second dose.  Pneumococcal 13-valent conjugate (PCV13) vaccine. You may need this if you have certain conditions and were not previously vaccinated.  Pneumococcal polysaccharide (PPSV23) vaccine. You may need one or two doses if you smoke cigarettes or if you have certain conditions.  Meningococcal vaccine. You may need this if you have certain conditions.  Hepatitis A vaccine. You may need this if you have certain conditions or if you travel or work in places where you may be exposed to hepatitis A.  Hepatitis B vaccine. You may need this if you have certain conditions or if you travel or work in places where you may be exposed to hepatitis B.  Haemophilus influenzae type b (Hib) vaccine. You may need this if you have certain conditions. Talk to your health care provider about which screenings and vaccines you need and how often you need them. This information is not intended to replace advice given to you by your health care provider. Make sure you discuss any questions you have with your health care provider. Document Released: 11/19/2015  Document Revised: 12/13/2017 Document Reviewed: 08/24/2015 Elsevier Interactive Patient Education  2019 [employer].

## 2019-04-22 NOTE — Progress Notes (Signed)
Subjective  CC:  Chief Complaint  Patient presents with  . Establish Care    Dr. Brigitte Pulse was PCP, Last CPE 09/2017  . Annual Exam    Non-fasting.. Had green tea, yogurt, cereal, and blackberries    HPI: Shawna Bass is a 63 y.o. female who presents to [facility] today to establish care with me as a new patient. She request a physical  She has the following concerns or needs:  No concerns.reviewed her PMH in detail. See her 2 page summary that she brought. Overall very healthy. 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 Shoulder pain and hemorrhoids.   HM: mammo due in feb. dexa due at that time as well. CRC is up to date and normal. Pap due in October. Lives a healthy lifestyle  ROS: no h/o HTN. No cp. + stress in life now but manages very well and has a plan to deal with it.   Assessment  1. Annual physical exam   2. Chronic vertigo   3. Elevated blood pressure reading without diagnosis of hypertension   4. Impingement syndrome of left shoulder   5. External hemorrhoids      Plan  Female Wellness Visit:  Age appropriate Health Maintenance and Prevention measures were discussed with patient. Included topics are cancer screening recommendations, ways to keep healthy (see AVS) including dietary and exercise recommendations, regular eye and dental care, use of seat belts, and avoidance of moderate alcohol use and tobacco use. Will return for pap in October. Will order mammo with dexa for 12/2019  BMI: discussed patient's BMI and encouraged positive lifestyle modifications to help get to or maintain a target BMI.  HM needs and immunizations were addressed and ordered. See below for orders. See HM and immunization section for updates.all up to date  Routine labs and screening tests ordered including cmp, cbc and lipids where appropriate.  Discussed recommendations regarding Vit D and calcium supplementation (see AVS)  No acute issues.      Follow up:  Return in about 4 months (around 08/22/2019) for pap smear. Orders Placed This Encounter  Procedures  . CBC with Differential/Platelet  . Comprehensive metabolic panel  . Lipid panel  . TSH   No orders of the defined types were placed in this encounter.    Depression screen [facility] 2/9 04/22/2019 09/10/2017 08/29/2016  Decreased Interest 0 0 0  Down, Depressed, Hopeless 0 0 0  PHQ - 2 Score 0 0 0    We updated and reviewed the patient's past history in detail and it is documented below.  Patient Active Problem List   Diagnosis Date Noted  . Chronic vertigo 04/22/2019  . Impingement syndrome of left shoulder 04/22/2019    2017, Dr. Onnie Graham   . External hemorrhoids 04/22/2019   Health Maintenance  Topic Date Due  . TETANUS/TDAP  08/29/2026 (Originally 07/29/1975)  . INFLUENZA VACCINE  06/07/2019  . PAP SMEAR-Modifier  08/30/2019  . MAMMOGRAM  12/17/2020  . COLONOSCOPY  12/06/2027  . Hepatitis C Screening  Completed  . HIV Screening  Discontinued   Immunization History  Administered Date(s) Administered  . Zoster Recombinat (Shingrix) 06/20/2018, 09/11/2018   Current Meds  Medication Sig  . Ascorbic Acid (VITAMIN C) 100 MG tablet Take 100 mg by mouth daily.  . Cholecalciferol (VITAMIN D PO) Take by mouth.  . Multiple Vitamin (MULTIVITAMIN) tablet Take by mouth.  . Polyethylene Glycol 3350 (MIRALAX PO) Take by mouth daily.   Marland Kitchen  Zoster Vaccine Live, PF, (ZOSTAVAX) 19400 UNT/0.65ML injection Inject into the muscle.    Allergies: Patient is allergic to sulfur. Past Medical History Patient  has a past medical history of Cataract, External hemorrhoids (04/22/2019), and Impingement syndrome of left shoulder (04/22/2019). Past Surgical History Patient  has no past surgical history on file. Family History: Patient family history includes Healthy in her daughter and son; Heart disease in her father; Stroke in her mother. Social History:  Patient  reports that she  has never smoked. She has never used smokeless tobacco. She reports current alcohol use of about 10.0 standard drinks of alcohol per week. She reports that she does not use drugs.  Review of Systems: Constitutional: negative for fever or malaise Ophthalmic: negative for photophobia, double vision or loss of vision Cardiovascular: negative for chest pain, dyspnea on exertion, or new LE swelling Respiratory: negative for SOB or persistent cough Gastrointestinal: negative for abdominal pain, change in bowel habits or melena Genitourinary: negative for dysuria or gross hematuria Musculoskeletal: negative for new gait disturbance or muscular weakness Integumentary: negative for new or persistent rashes Neurological: negative for TIA or stroke symptoms Psychiatric: negative for SI or delusions Allergic/Immunologic: negative for hives  Patient Care Team    Relationship Specialty Notifications Start End  Willow OraAndy, Kyrstan Gotwalt L, MD PCP - General Family Medicine  04/22/19   Francena HanlySupple, Kevin, MD Consulting Physician Orthopedic Surgery  04/22/19   Charna ElizabethMann, Jyothi, MD Consulting Physician Gastroenterology  04/22/19   Elwin MochaShah, Christopher Tulip, MD Consulting Physician Ophthalmology  04/22/19     Objective  Vitals: BP (!) 142/86   Pulse 72   Temp 98.2 F (36.8 C) (Oral)   Resp 14   Ht 5\' 5"  (1.651 m)   Wt 138 lb 12.8 oz (63 kg)   SpO2 99%   BMI 23.10 kg/m  General:  Well developed, well nourished, no acute distress  Psych:  Alert and oriented,normal mood and affect HEENT:  Normocephalic, atraumatic, non-icteric sclera, PERRL,  supple neck without adenopathy, mass or thyromegaly Cardiovascular:  RRR without gallop, rub or murmur, nondisplaced PMI Respiratory:  Good breath sounds bilaterally, CTAB with normal respiratory effort Gastrointestinal: normal bowel sounds, soft, non-tender, no noted masses. No HSM MSK: no deformities, contusions. Joints are without erythema or swelling Skin:  Warm, no rashes or  suspicious lesions noted Neurologic:    Mental status is normal. Gross motor and sensory exams are normal. Normal gait Breast exam:normal breast tissue bilaterally w/o mass or ttp, no nipple discharge or adenopathy  Commons side effects, risks, benefits, and alternatives for medications and treatment plan prescribed today were discussed, and the patient expressed understanding of the given instructions. Patient is instructed to call or message via MyChart if he/she has any questions or concerns regarding our treatment plan. No barriers to understanding were identified. We discussed Red Flag symptoms and signs in detail. Patient expressed understanding regarding what to do in case of urgent or emergency type symptoms.   Medication list was reconciled, printed and provided to the patient in AVS. Patient instructions and summary information was reviewed with the patient as documented in the AVS. This note was prepared with assistance of Dragon voice recognition software. Occasional wrong-word or sound-a-like substitutions may have occurred due to the inherent limitations of voice recognition software

## 2019-09-27 ENCOUNTER — Encounter: Payer: Self-pay | Admitting: Family Medicine

## 2019-10-05 ENCOUNTER — Encounter: Payer: Self-pay | Admitting: Family Medicine

## 2019-12-17 ENCOUNTER — Encounter: Payer: Self-pay | Admitting: Family Medicine

## 2019-12-20 ENCOUNTER — Ambulatory Visit: Payer: Commercial Managed Care - PPO

## 2020-01-15 ENCOUNTER — Encounter: Payer: Self-pay | Admitting: Family Medicine

## 2020-01-15 ENCOUNTER — Other Ambulatory Visit: Payer: Self-pay

## 2020-01-15 DIAGNOSIS — Z1231 Encounter for screening mammogram for malignant neoplasm of breast: Secondary | ICD-10-CM

## 2020-01-15 DIAGNOSIS — Z13820 Encounter for screening for osteoporosis: Secondary | ICD-10-CM

## 2020-01-15 DIAGNOSIS — E2839 Other primary ovarian failure: Secondary | ICD-10-CM

## 2020-01-15 NOTE — Telephone Encounter (Signed)
Please order mammo and dexa. Both are due.  Please have her schedule her cpe in June. Thanks.

## 2020-01-15 NOTE — Telephone Encounter (Signed)
2nd Covid vaccine documented.  Last CPE was on 04/22/2019. Ok to place bone density referral? Please advise. Thanks.

## 2020-01-26 ENCOUNTER — Ambulatory Visit (INDEPENDENT_AMBULATORY_CARE_PROVIDER_SITE_OTHER): Payer: Commercial Managed Care - PPO | Admitting: Family

## 2020-01-26 ENCOUNTER — Other Ambulatory Visit: Payer: Self-pay

## 2020-01-26 ENCOUNTER — Encounter: Payer: Self-pay | Admitting: Family

## 2020-01-26 ENCOUNTER — Telehealth: Payer: Self-pay | Admitting: Family Medicine

## 2020-01-26 VITALS — BP 126/72 | HR 96 | Temp 99.0°F | Ht 65.0 in | Wt 140.0 lb

## 2020-01-26 DIAGNOSIS — W57XXXA Bitten or stung by nonvenomous insect and other nonvenomous arthropods, initial encounter: Secondary | ICD-10-CM

## 2020-01-26 DIAGNOSIS — L03311 Cellulitis of abdominal wall: Secondary | ICD-10-CM | POA: Diagnosis not present

## 2020-01-26 MED ORDER — DOXYCYCLINE HYCLATE 100 MG PO TABS: 100.0000 mg | ORAL_TABLET | Freq: Two times a day (BID) | ORAL | 0 refills | Status: DC

## 2020-01-26 NOTE — Telephone Encounter (Signed)
She had a tick bite 10 days ago? I can't see what her sxs are? Was this triaged and they rec OV w/in 4 hours? I don't understand ... need more information. What is her complaint.

## 2020-01-26 NOTE — Telephone Encounter (Signed)
Nurse Assessment Nurse: May, RN, Tammy Date/Time Lamount Cohen Time): 01/26/2020 12:46:26 PM Confirm and document reason for call. If symptomatic, describe symptoms. ---Caller states she had a tick bite on her abdomen ten days ago. Caller states she has noticed a bullseye at the tick site. Has the patient had close contact with a person known or suspected to have the novel coronavirus illness OR traveled / lives in area with major community spread (including international travel) in the last 14 days from the onset of symptoms? * If Asymptomatic, screen for exposure and travel within the last 14 days. ---No Does the patient have any new or worsening symptoms? ---Yes Will a triage be completed? ---Yes Related visit to physician within the last 2 weeks? ---No Does the PT have any chronic conditions? (i.e. diabetes, asthma, this includes High risk factors for pregnancy, etc.) ---No Is this a behavioral health or substance abuse call? ---No Guidelines Guideline Title Affirmed Question Affirmed Notes Nurse Date/Time (Eastern Time) Tick Bite [1] Fever AND [2] spreading red area or streak May, RN, Tammy 01/26/2020 12:47:42 PM Disp. Time Lamount Cohen Time) Disposition Final User PLEASE NOTE: All timestamps contained within this report are represented as [place] Standard Time. CONFIDENTIALTY NOTICE: This fax transmission is intended only for the addressee. It contains information that is legally privileged, confidential or otherwise protected from use or disclosure. If you are not the intended recipient, you are strictly prohibited from reviewing, disclosing, copying using or disseminating any of this information or taking any action in reliance on or regarding this information. If you have received this fax in error, please notify us immediately by telephone so that we can arrange for its return to [place]. Phone: [phone], Toll-Free: [phone], Fax: [phone] Page: 2 of 2 Call Id:  [id number] 01/26/2020 12:51:20 PM See HCP within 4 Hours (or PCP triage) Yes May, RN, Tammy Caller Disagree/Comply Comply Caller Understands Yes PreDisposition Did not know what to do Care Advice Given Per Guideline SEE HCP WITHIN 4 HOURS (OR PCP TRIAGE): * IF OFFICE WILL BE OPEN: You need to be seen within the next 3 or 4 hours. Call your doctor (or NP/PA) now or as soon as the office opens. CALL BACK IF: * You become worse. CARE ADVICE given per Tick Bites (Adult) guideline.

## 2020-01-26 NOTE — Progress Notes (Signed)
  Shawna Bass is a 64 y.o. female with the following history as recorded in EpicCare:  Patient Active Problem List   Diagnosis Date Noted  . Chronic vertigo 04/22/2019  . Impingement syndrome of left shoulder 04/22/2019  . External hemorrhoids 04/22/2019    Current Outpatient Medications  Medication Sig Dispense Refill  . Ascorbic Acid (VITAMIN C) 100 MG tablet Take 100 mg by mouth daily.    . calcium carbonate (OSCAL) 1500 (600 Ca) MG TABS tablet Take by mouth 2 (two) times daily with a meal.    . Multiple Vitamin (MULTIVITAMIN) tablet Take by mouth.    . Polyethylene Glycol 3350 (MIRALAX PO) Take by mouth daily.     Marland Kitchen doxycycline (VIBRA-TABS) 100 MG tablet Take 1 tablet (100 mg total) by mouth 2 (two) times daily. 28 tablet 0   No current facility-administered medications for this visit.    Allergies: Sulfur  Past Medical History:  Diagnosis Date  . Cataract   . External hemorrhoids 04/22/2019  . Impingement syndrome of left shoulder 04/22/2019   2017, Dr. Rennis Chris    History reviewed. No pertinent surgical history.  Family History  Problem Relation Age of Onset  . Stroke Mother   . Heart disease Father   . Healthy Daughter   . Healthy Son     Social History   Tobacco Use  . Smoking status: Never Smoker  . Smokeless tobacco: Never Used  Substance Use Topics  . Alcohol use: Yes    Alcohol/week: 10.0 standard drinks    Types: 10 Standard drinks or equivalent per week    Subjective:  Tick bite on right lower abdomen; bite was 10 days ago; patient is concerned about circle noted around the site of the insect; per patient, area is actually looking better today; no fever or joint aches; no rash on extremities;   Objective:  Vitals:   01/26/20 1529  BP: 126/72  Pulse: 96  Temp: 99 F (37.2 C)  TempSrc: Oral  SpO2: 97%  Weight: 140 lb (63.5 kg)  Height: 5\' 5"  (1.651 m)    General: Well developed, well nourished, in no acute distress  Skin : Warm and dry.  Erythematous area c/w insect bite with surrounding erythema Head: Normocephalic and atraumatic  Lungs: Respirations unlabored;  Neurologic: Alert and oriented; speech intact; face symmetrical; moves all extremities well; CNII-XII intact without focal deficit   Assessment:  1. Cellulitis of abdominal wall   2. Insect bite, unspecified site, initial encounter     Plan:  Rx for Doxycycline 100 mg bid x 14 days; apply OTC cortisone to area as well; follow-up with her PCP if symptoms persist.  This visit occurred during the SARS-CoV-2 public health emergency.  Safety protocols were in place, including screening questions prior to the visit, additional usage of staff PPE, and extensive cleaning of exam room while observing appropriate contact time as indicated for disinfecting solutions.     No follow-ups on file.  No orders of the defined types were placed in this encounter.   Requested Prescriptions   Signed Prescriptions Disp Refills  . doxycycline (VIBRA-TABS) 100 MG tablet 28 tablet 0    Sig: Take 1 tablet (100 mg total) by mouth 2 (two) times daily.

## 2020-01-26 NOTE — Telephone Encounter (Signed)
See below

## 2020-01-26 NOTE — Telephone Encounter (Signed)
Patient is complaining of a Tick Bite, Fever AND spreading red area/ streak per the triage note which happened 10 days ago. This was triage and she was told to follow up if symptoms get any worse.

## 2020-01-26 NOTE — Telephone Encounter (Signed)
Pt was seen today as acute care visit at [facility]. See note

## 2020-01-28 LAB — BASIC METABOLIC PANEL: Glucose: 108

## 2020-01-28 LAB — HEMOGLOBIN A1C: Hemoglobin A1C: 5.4

## 2020-03-31 ENCOUNTER — Ambulatory Visit
Admission: RE | Admit: 2020-03-31 | Discharge: 2020-03-31 | Disposition: A | Discharge status: Home / Self Care | Payer: Commercial Managed Care - PPO | Source: Ambulatory Visit | Attending: Family Medicine | Admitting: Family Medicine

## 2020-03-31 ENCOUNTER — Other Ambulatory Visit: Payer: Self-pay

## 2020-03-31 DIAGNOSIS — E2839 Other primary ovarian failure: Secondary | ICD-10-CM

## 2020-03-31 DIAGNOSIS — Z1231 Encounter for screening mammogram for malignant neoplasm of breast: Secondary | ICD-10-CM

## 2020-04-01 ENCOUNTER — Encounter: Payer: Self-pay | Admitting: Family Medicine

## 2020-04-01 ENCOUNTER — Other Ambulatory Visit: Payer: Self-pay | Admitting: Family Medicine

## 2020-04-01 DIAGNOSIS — M810 Age-related osteoporosis without current pathological fracture: Secondary | ICD-10-CM

## 2020-04-01 DIAGNOSIS — R928 Other abnormal and inconclusive findings on diagnostic imaging of breast: Secondary | ICD-10-CM

## 2020-04-01 HISTORY — DX: Age-related osteoporosis without current pathological fracture: M81.0

## 2020-04-06 ENCOUNTER — Telehealth: Payer: Self-pay | Admitting: Family Medicine

## 2020-04-06 NOTE — Telephone Encounter (Signed)
Called patient to reschedule an appointment on 04/28/20, patient states it was for a physical and to discuss osteoporosis, patient wanted to know if Dr.Andy is okay with her waiting until October to discuss that. States she does not really want to pay for an office visit when the appointment could be covered under insurance.

## 2020-04-07 NOTE — Telephone Encounter (Signed)
Patient returning call to [facility]. Attempted to reach, unavailable. Patient would like a return call.

## 2020-04-07 NOTE — Telephone Encounter (Signed)
No DPR in chart, LMOVM to return call

## 2020-04-07 NOTE — Telephone Encounter (Signed)
Spoke with patient, she is OK with keeping her appt on 04/14/20

## 2020-04-13 ENCOUNTER — Ambulatory Visit
Admission: RE | Admit: 2020-04-13 | Discharge: 2020-04-13 | Disposition: A | Discharge status: Home / Self Care | Payer: Commercial Managed Care - PPO | Source: Ambulatory Visit | Attending: Family Medicine | Admitting: Family Medicine

## 2020-04-13 ENCOUNTER — Other Ambulatory Visit: Payer: Self-pay

## 2020-04-13 DIAGNOSIS — R928 Other abnormal and inconclusive findings on diagnostic imaging of breast: Secondary | ICD-10-CM

## 2020-04-14 ENCOUNTER — Ambulatory Visit: Payer: Commercial Managed Care - PPO | Admitting: Family Medicine

## 2020-04-27 ENCOUNTER — Telehealth: Payer: Self-pay | Admitting: Family Medicine

## 2020-04-27 NOTE — Telephone Encounter (Signed)
Pt called stating she had a bone density scan done on 03/31/2020. Pt stated the scan was coded as diagnostic instead of preventative resulting in a bill. Pt is asking to have this reviewed. Please advise.

## 2020-04-27 NOTE — Telephone Encounter (Addendum)
Shawna Bass, this was completed at [facility].   Ordered with diagnosis of hypoestrogenism. Can this be changed?

## 2020-04-28 ENCOUNTER — Encounter: Payer: Commercial Managed Care - PPO | Admitting: Family Medicine

## 2020-05-12 ENCOUNTER — Telehealth: Payer: Self-pay | Admitting: Family Medicine

## 2020-05-12 NOTE — Telephone Encounter (Signed)
Patient is calling in this afternoon stating that her bone density was coded as a diagnostic not as a preventive, so she asked if someone is able to re-submit it correctly. Has not heard anything from anyone about this.

## 2020-05-13 NOTE — Telephone Encounter (Signed)
Spoke to pt told her Dexa scan was diagnostic due to having Osteoporosis. Pt said it was her first scan and she was just diagnosed now with Osteoporosis so it was a preventive scan. Told pt okay, our front office person is on vacation this week and message was sent to her. I will see what we can do and get back to you. Pt verbalized understanding.

## 2020-05-13 NOTE — Telephone Encounter (Signed)
Shawna Bass, can you look into this for pt and have it fixed?

## 2020-05-18 NOTE — Telephone Encounter (Signed)
Dr. Mardelle Matte, could you provide me with a code to send to coding for correction? Thanks TP

## 2020-05-18 NOTE — Telephone Encounter (Signed)
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   This is a screening test.

## 2020-05-21 NOTE — Telephone Encounter (Signed)
Hello all,   This was ordered with dx of low estrogen(which is not covered as screening), does patient have this? I did not fine in labs or notes.  When Medicare, usually ordered with  Z78.0 Asymptomatic menopausal state  It is not in any OV notes that I have found only on the order of the dexa.  I believe , you would need to contact dexa imaging group to have them send a corrected claim to the insurance company if what Dr. Mardelle Matte associated is not correct.  I am not sure if can be corrected on order, she may have to do something in writing if incorrect and make amendment in chart, but not sure how to do that.    Dr. Mardelle Matte, I will email you the Medicare screening spreadsheet from 2021 , we sent out at the beginning of year.   Thank you,  Dawn

## 2020-05-21 NOTE — Telephone Encounter (Signed)
I will take a look at what you sent out.  I was specifically told to use hypoestrogenism by either the breast center or [facility] in the past.  I will change to asymptomatic menopausal state for screening.   And this is ridiculous as you know; either they cover screening for screening purposes or not but thanks for your help. Please resubmit the claim with new dx.   Dr. Mardelle Matte

## 2020-05-21 NOTE — Telephone Encounter (Signed)
Dawn,  Can you please look into this?

## 2020-05-26 ENCOUNTER — Other Ambulatory Visit: Payer: Self-pay | Admitting: Family Medicine

## 2020-05-27 LAB — VITAMIN D 25 HYDROXY (VIT D DEFICIENCY, FRACTURES): Vit D, 25-Hydroxy: 72

## 2020-06-04 NOTE — Telephone Encounter (Signed)
I received a call from Ms Garrow regarding her Bone Scan which was  completed on 03-31-20. She advised that her insurance, [employer], would pay for a screening study and had been working on getting this matter resolved for some time now. I called the breast center center and spoke with Lana in their billing department. She will resubmit the claim with the Z78.0 diagnosis indicated by Dr. Mardelle Matte. I gave the patient Lana's name and number [phone] for her records.

## 2020-06-04 NOTE — Telephone Encounter (Signed)
I would talk with Para March, I think she was handling, not sure the outcome.  Thanks, [employer]

## 2020-06-04 NOTE — Telephone Encounter (Signed)
Dawn, do I need to do anything in regard to this or did it get resubmitted?  Thanks TP

## 2020-06-21 IMAGING — MG DIGITAL SCREENING BILAT W/ TOMO W/ CAD
8 series · 9 of 24 positions shown · non-contrast
Comparison: Previous exam(s).

CLINICAL DATA: Screening.

EXAM:
DIGITAL SCREENING BILATERAL MAMMOGRAM WITH TOMO AND CAD

[R CC synth-2D]
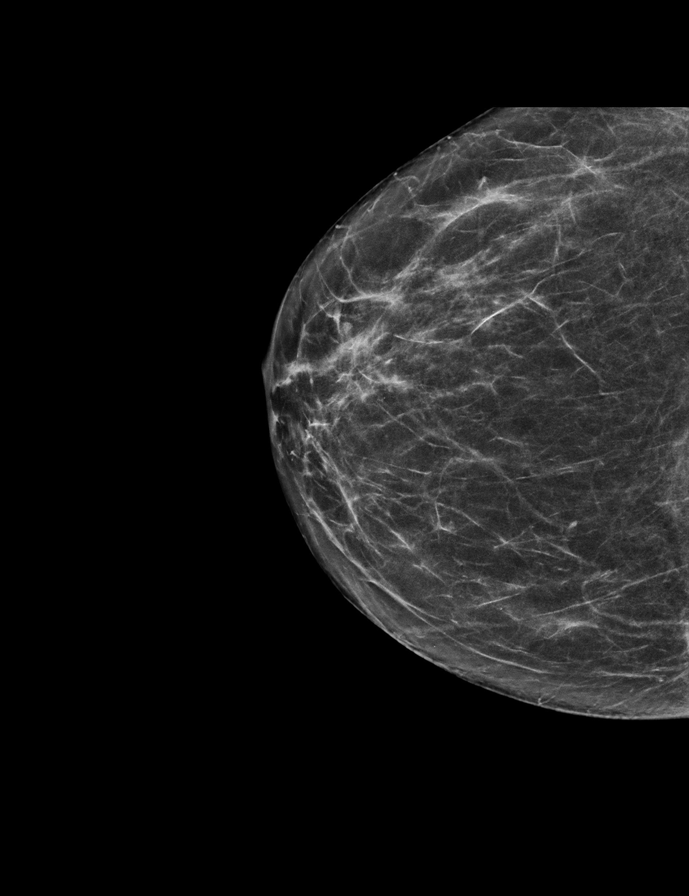

[L CC synth-2D]
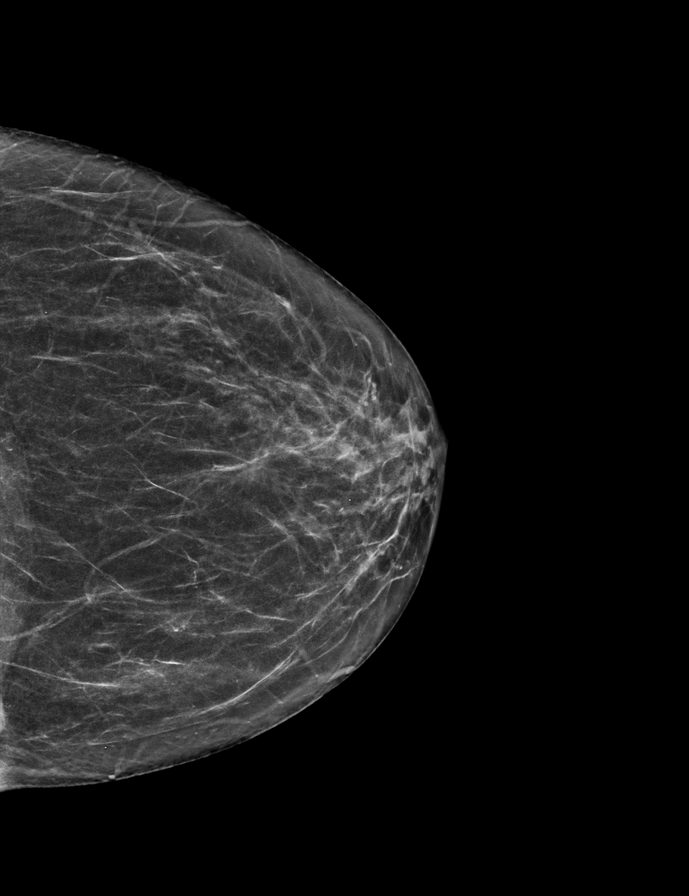

[L MLO synth-2D]
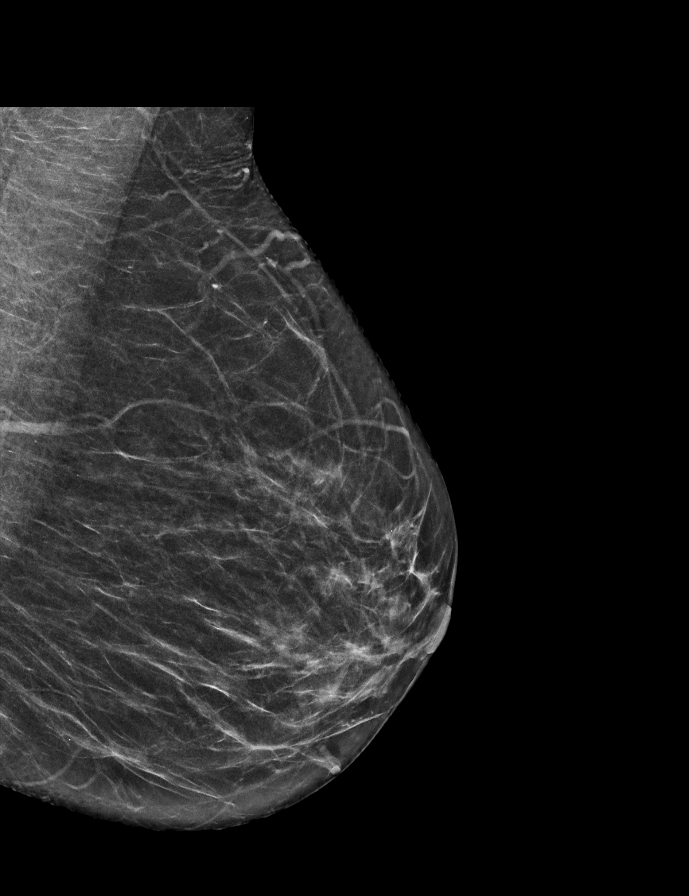

[R MLO synth-2D]
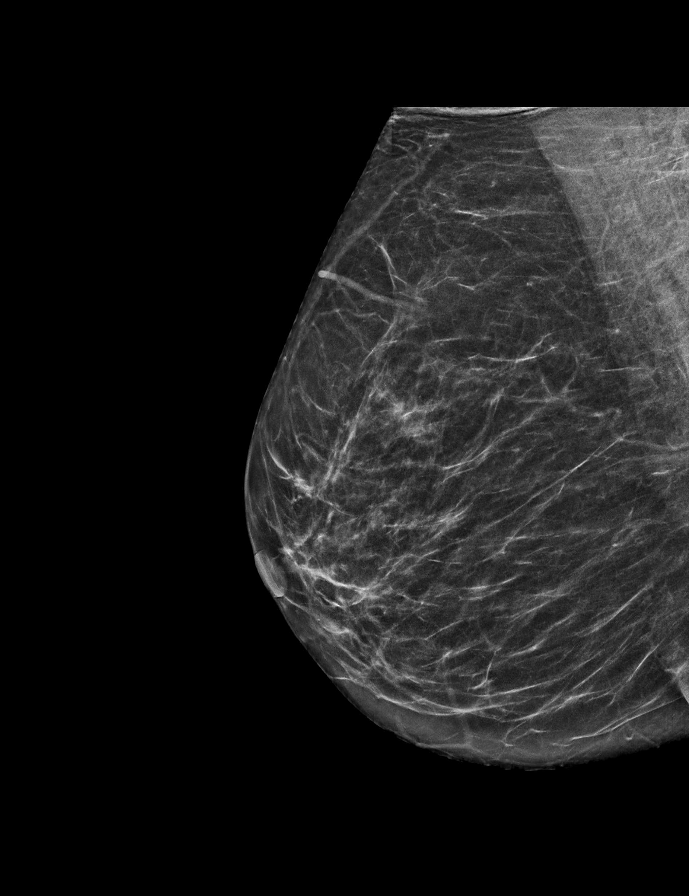

[L CC tomo · 2 of 53 frames shown]
[frame 18/53]
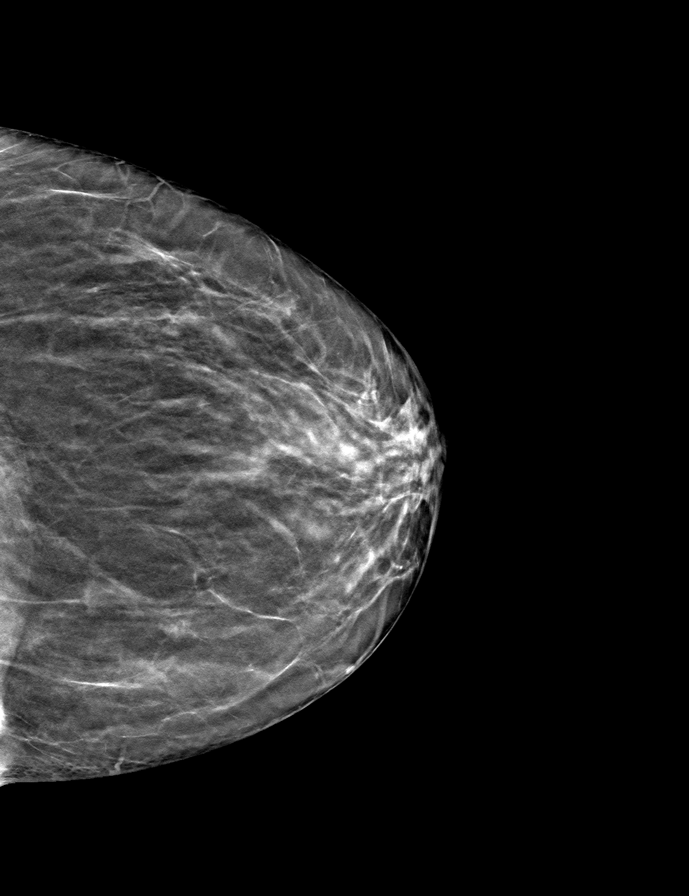
[frame 27/53]
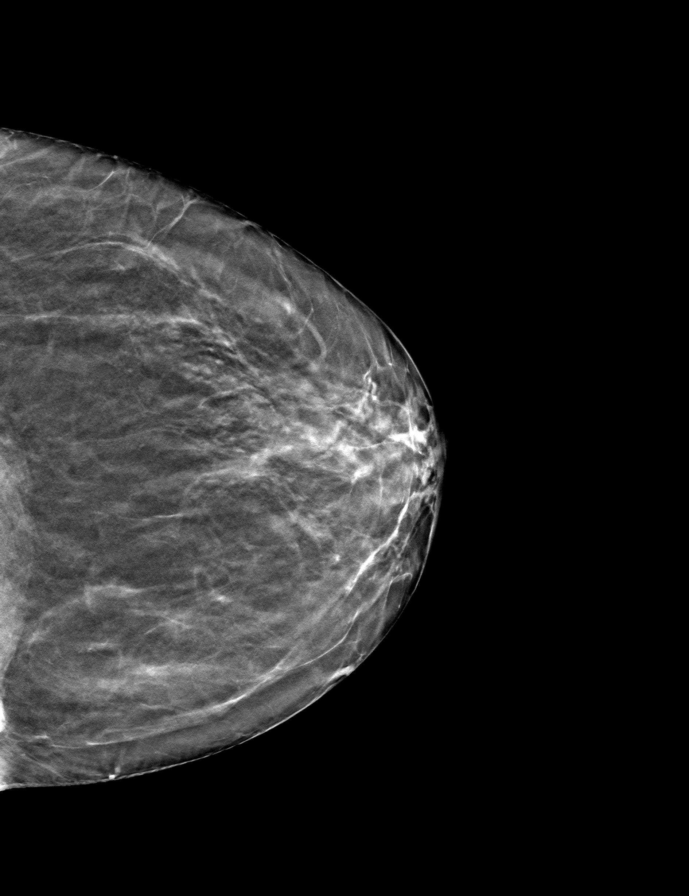

[L MLO tomo · tomo slice 27/53.0]
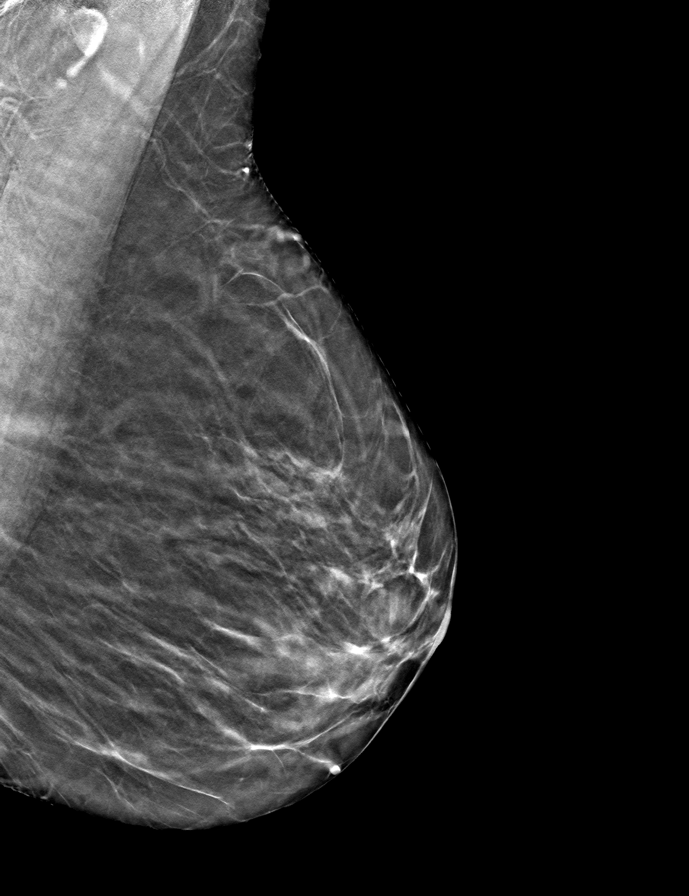

[R MLO tomo · tomo slice 28/55.0]
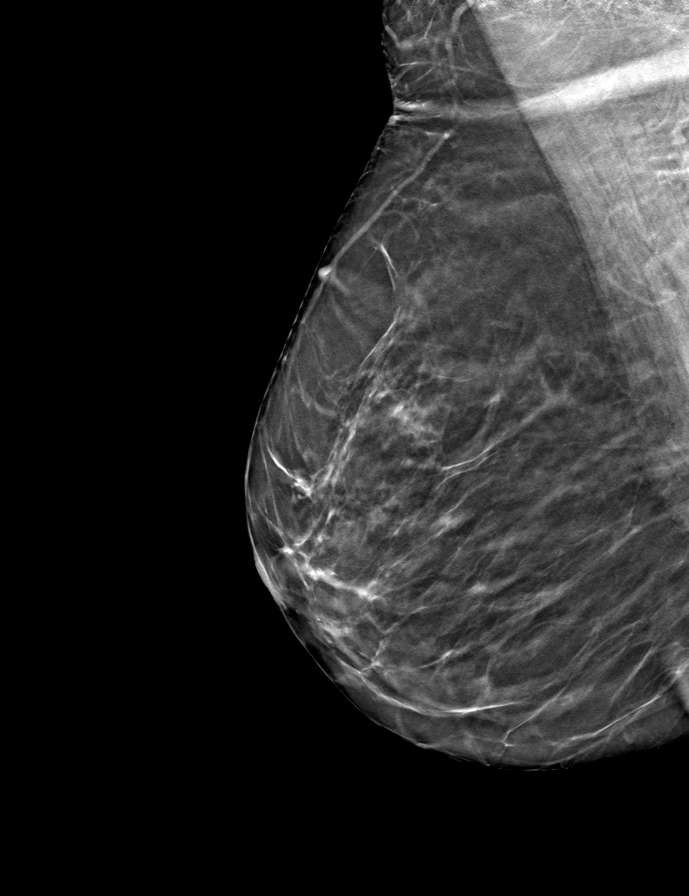

[R CC tomo · tomo slice 29/56.0]
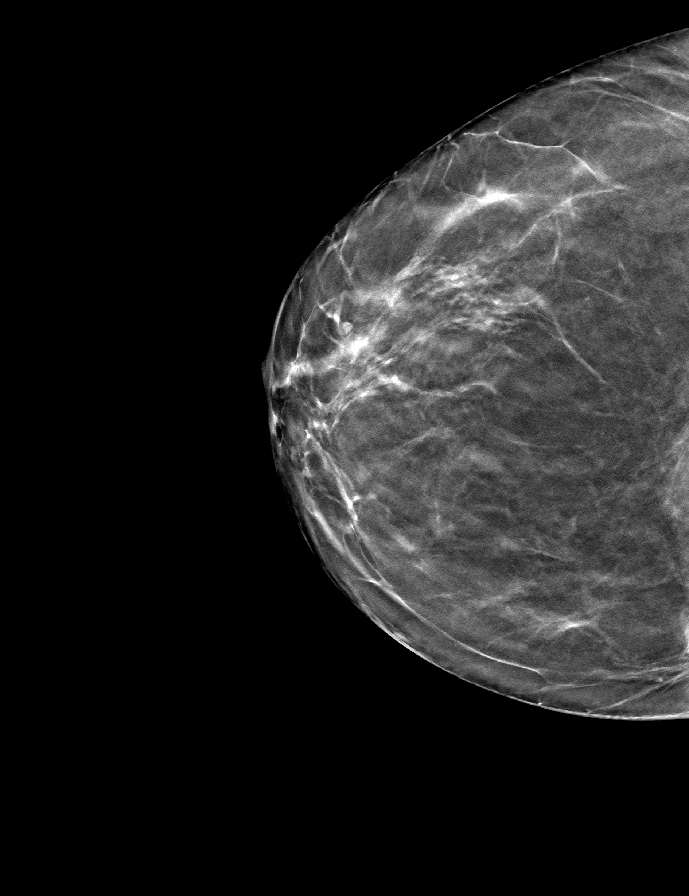

[9 of 24 positions shown; findings below may reference images not displayed]

ACR Breast Density Category b: There are scattered areas of
fibroglandular density.
FINDINGS: In the right breast, a possible asymmetry warrants further
evaluation. In the left breast, no findings suspicious for
malignancy. Images were processed with CAD.
IMPRESSION: Further evaluation is suggested for possible asymmetry in the right
breast.

RECOMMENDATION:
Diagnostic mammogram and possibly ultrasound of the right breast.
(Code:[id number])

The patient will be contacted regarding the findings, and additional
imaging will be scheduled.

BI-RADS CATEGORY  0: Incomplete. Need additional imaging evaluation
and/or prior mammograms for comparison.

## 2020-09-02 ENCOUNTER — Other Ambulatory Visit: Payer: Self-pay

## 2020-09-02 ENCOUNTER — Other Ambulatory Visit (HOSPITAL_COMMUNITY)
Admission: RE | Admit: 2020-09-02 | Discharge: 2020-09-02 | Disposition: A | Discharge status: Home / Self Care | Payer: Commercial Managed Care - PPO | Source: Ambulatory Visit | Attending: Family Medicine | Admitting: Family Medicine

## 2020-09-02 ENCOUNTER — Ambulatory Visit (INDEPENDENT_AMBULATORY_CARE_PROVIDER_SITE_OTHER): Payer: Commercial Managed Care - PPO | Admitting: Family Medicine

## 2020-09-02 ENCOUNTER — Encounter: Payer: Self-pay | Admitting: Family Medicine

## 2020-09-02 VITALS — BP 120/78 | HR 76 | Temp 97.9°F | Resp 16 | Ht 65.0 in | Wt 138.6 lb

## 2020-09-02 DIAGNOSIS — Z0000 Encounter for general adult medical examination without abnormal findings: Secondary | ICD-10-CM | POA: Diagnosis not present

## 2020-09-02 DIAGNOSIS — M810 Age-related osteoporosis without current pathological fracture: Secondary | ICD-10-CM | POA: Diagnosis not present

## 2020-09-02 DIAGNOSIS — Z23 Encounter for immunization: Secondary | ICD-10-CM

## 2020-09-02 DIAGNOSIS — Z124 Encounter for screening for malignant neoplasm of cervix: Secondary | ICD-10-CM

## 2020-09-02 DIAGNOSIS — R42 Dizziness and giddiness: Secondary | ICD-10-CM | POA: Diagnosis not present

## 2020-09-02 NOTE — Progress Notes (Signed)
Subjective  Chief Complaint  Patient presents with   Annual Exam    fasting   Gynecologic Exam   Osteoporosis    HPI: Shawna Bass is a 64 y.o. female who presents to [facility] today for a Female Wellness Visit. She also has the concerns and/or needs as listed above in the chief complaint. These will be addressed in addition to the Health Maintenance Visit.   Wellness Visit: annual visit with health maintenance review and exam with Pap   Health maintenance: Healthy 64 year old female here for physical.  Pap is due last year.  No history of abnormals.  Breast cancer and colon cancer screens are up-to-date.  Flu vaccine today. Chronic disease f/u and/or acute problem visit: (deemed necessary to be done in addition to the wellness visit):  Osteoporosis by recent bone density scan: Reviewed results together.  Lowest T score -2.6.  Understands risk and benefits of treatment.  Prefers not to start treatment.  She has increased calcium and vitamin D.  Start working with a Systems analyst.  Strength training.  Would like to defer if possible.  No history of fractures  Chronic vertigo: Stable  Assessment  1. Annual physical exam   2. Cervical cancer screening   3. Chronic vertigo   4. Age-related osteoporosis without current pathological fracture   5. Need for immunization against influenza      Plan  Female Wellness Visit:  Age appropriate Health Maintenance and Prevention measures were discussed with patient. Included topics are cancer screening recommendations, ways to keep healthy (see AVS) including dietary and exercise recommendations, regular eye and dental care, use of seat belts, and avoidance of moderate alcohol use and tobacco use.  Pap smear with high risk HPV testing today.  If negative, this will be her last  BMI: discussed patient's BMI and encouraged positive lifestyle modifications to help get to or maintain a target BMI.  HM  needs and immunizations were addressed and ordered. See below for orders. See HM and immunization section for updates.  High-dose flu vaccine today  Routine labs and screening tests ordered including cmp, cbc and lipids where appropriate.  Discussed recommendations regarding Vit D and calcium supplementation (see AVS)  Chronic disease management visit and/or acute problem visit:  Osteoporosis: Counseling education given.  Will defer treatment, prescriptions.  Continue strength training, calcium and vitamin D.  Recheck bone density in 2 years.   Follow up: Return in about 1 year (around 09/02/2021) for complete physical.  Orders Placed This Encounter  Procedures   Flu Vaccine QUAD High Dose(Fluad)   CBC with Differential/Platelet   COMPLETE METABOLIC PANEL WITH GFR   Lipid panel   TSH   No orders of the defined types were placed in this encounter.     Lifestyle: Body mass index is 23.06 kg/m. Wt Readings from Last 3 Encounters:  09/02/20 138 lb 9.6 oz (62.9 kg)  01/26/20 140 lb (63.5 kg)  04/22/19 138 lb 12.8 oz (63 kg)    Patient Active Problem List   Diagnosis Date Noted   Osteoporosis 04/01/2020    Dexa 03/2020 T = -2.8 femur, -1.9 spine. New dx: pt is weight training with trainer, increased calcium and vit D. Defers treatment; recheck 2023    Chronic vertigo 04/22/2019   Impingement syndrome of left shoulder 04/22/2019    2017, Dr. Rennis Chris    External hemorrhoids 04/22/2019   Health Maintenance  Topic Date Due   TETANUS/TDAP  08/29/2026 (Originally  07/29/1975)   PNA vac Low Risk Adult (2 of 2 - PCV13) 09/28/2020   MAMMOGRAM  03/31/2021   DEXA SCAN  03/31/2022   COLONOSCOPY  12/06/2027   INFLUENZA VACCINE  Completed   COVID-19 Vaccine  Completed   Hepatitis C Screening  Completed   Immunization History  Administered Date(s) Administered   Fluad Quad(high Dose 65+) 09/02/2020   Influenza,inj,Quad PF,6+ Mos 08/15/2019   Moderna SARS-COVID-2  Vaccination 12/12/2019, 01/14/2020   Pneumococcal Polysaccharide-23 09/29/2019   Zoster Recombinat (Shingrix) 06/20/2018, 09/11/2018   We updated and reviewed the patient's past history in detail and it is documented below. Allergies: Patient is allergic to sulfa antibiotics. Past Medical History Patient  has a past medical history of Cataract, External hemorrhoids (04/22/2019), Impingement syndrome of left shoulder (04/22/2019), and Osteoporosis (04/01/2020). Past Surgical History Patient  has no past surgical history on file. Family History: Patient family history includes Healthy in her daughter and son; Heart disease in her father; Stroke in her mother. Social History:  Patient  reports that she has never smoked. She has never used smokeless tobacco. She reports current alcohol use of about 10.0 standard drinks of alcohol per week. She reports that she does not use drugs.  Review of Systems: Constitutional: negative for fever or malaise Ophthalmic: negative for photophobia, double vision or loss of vision Cardiovascular: negative for chest pain, dyspnea on exertion, or new LE swelling Respiratory: negative for SOB or persistent cough Gastrointestinal: negative for abdominal pain, change in bowel habits or melena Genitourinary: negative for dysuria or gross hematuria, no abnormal uterine bleeding or disharge Musculoskeletal: negative for new gait disturbance or muscular weakness Integumentary: negative for new or persistent rashes, no breast lumps Neurological: negative for TIA or stroke symptoms Psychiatric: negative for SI or delusions Allergic/Immunologic: negative for hives  Patient Care Team    Relationship Specialty Notifications Start End  Willow Ora, MD PCP - General Family Medicine  04/22/19   Francena Hanly, MD Consulting Physician Orthopedic Surgery  04/22/19   Charna Elizabeth, MD Consulting Physician Gastroenterology  04/22/19   Elwin Mocha, MD Consulting  Physician Ophthalmology  04/22/19     Objective  Vitals: BP 120/78    Pulse 76    Temp 97.9 F (36.6 C) (Temporal)    Resp 16    Ht 5\' 5"  (1.651 m)    Wt 138 lb 9.6 oz (62.9 kg)    SpO2 97%    BMI 23.06 kg/m  General:  Well developed, well nourished, no acute distress  Psych:  Alert and orientedx3,normal mood and affect HEENT:  Normocephalic, atraumatic, non-icteric sclera,  supple neck without adenopathy, mass or thyromegaly Cardiovascular:  Normal S1, S2, RRR without gallop, rub or murmur Respiratory:  Good breath sounds bilaterally, CTAB with normal respiratory effort Gastrointestinal: normal bowel sounds, soft, non-tender, no noted masses. No HSM MSK: no deformities, contusions. Joints are without erythema or swelling.  Skin:  Warm, no rashes or suspicious lesions noted Neurologic:    Mental status is normal. CN 2-11 are normal. Gross motor and sensory exams are normal. Normal gait. No tremor Breast Exam: No mass, skin retraction or nipple discharge is appreciated in either breast. No axillary adenopathy. Fibrocystic changes are not noted Pelvic Exam: Normal external genitalia, no vulvar or vaginal lesions present.  Vaginal atrophy, clear cervix w/o CMT. Bimanual exam reveals a nontender fundus w/o masses, nl size. No adnexal masses present. No inguinal adenopathy. A PAP smear was performed.    Commons side effects,  risks, benefits, and alternatives for medications and treatment plan prescribed today were discussed, and the patient expressed understanding of the given instructions. Patient is instructed to call or message via MyChart if he/she has any questions or concerns regarding our treatment plan. No barriers to understanding were identified. We discussed Red Flag symptoms and signs in detail. Patient expressed understanding regarding what to do in case of urgent or emergency type symptoms.   Medication list was reconciled, printed and provided to the patient in AVS. Patient  instructions and summary information was reviewed with the patient as documented in the AVS. This note was prepared with assistance of Dragon voice recognition software. Occasional wrong-word or sound-a-like substitutions may have occurred due to the inherent limitations of voice recognition software  This visit occurred during the SARS-CoV-2 public health emergency.  Safety protocols were in place, including screening questions prior to the visit, additional usage of staff PPE, and extensive cleaning of exam room while observing appropriate contact time as indicated for disinfecting solutions.

## 2020-09-02 NOTE — Patient Instructions (Addendum)
Please return in 12 months for your annual complete physical; please come fasting.  I will release your lab results to you on your MyChart account with further instructions. Please reply with any questions.   If you have any questions or concerns, please don't hesitate to send me a message via MyChart or call the office at [phone]. Thank you for visiting with [place] today! It's our pleasure caring for you.   Calcium Intake Recommendations You can take Caltrate Plus twice a day or get it through your diet or other OTC supplements (Viactiv, OsCal etc)  Calcium is a mineral that affects many functions in the body, including:  Blood clotting.  Blood vessel function.  Nerve impulse conduction.  Hormone secretion.  Muscle contraction.  Bone and teeth functions.  Most of your body's calcium supply is stored in your bones and teeth. When your calcium stores are low, you may be at risk for low bone mass, bone loss, and bone fractures. Consuming enough calcium helps to grow healthy bones and teeth and to prevent breakdown over time. It is very important that you get enough calcium if you are:  A child undergoing rapid growth.  An adolescent girl.  A pre- or post-menopausal woman.  A woman whose menstrual cycle has stopped due to anorexia nervosa or regular intense exercise.  An individual with lactose intolerance or a milk allergy.  A vegetarian.  What is my plan? Try to consume the recommended amount of calcium daily based on your age. Depending on your overall health, your health care provider may recommend increased calcium intake.General daily calcium intake recommendations by age are:  Birth to 6 months: 200 mg.  Infants 7 to 12 months: 260 mg.  Children 1 to 3 years: 700 mg.  Children 4 to 8 years: 1,000 mg.  Children 9 to 13 years: 1,300 mg.  Teens 14 to 18 years: 1,300 mg.  Adults 19 to 50 years: 1,000 mg.  Adult women 51 to 70 years: 1,200 mg.  Adult men 51  to 70 years: 1,000 mg.  Adults 71 years and older: 1,200 mg.  Pregnant and breastfeeding teens: 1,300 mg.  Pregnant and breastfeeding adults: 1,000 mg.  What do I need to know about calcium intake?  In order for the body to absorb calcium, it needs vitamin D. You can get vitamin D through (we recommend getting [phone] units of Vitamin D daily) ? Direct exposure of the skin to sunlight. ? Foods, such as egg yolks, liver, saltwater fish, and fortified milk. ? Supplements.  Consuming too much calcium may cause: ? Constipation. ? Decreased absorption of iron and zinc. ? Kidney stones.  Calcium supplements may interact with certain medicines. Check with your health care provider before starting any calcium supplements.  Try to get most of your calcium from food. What foods can I eat? Grains  Fortified oatmeal. Fortified ready-to-eat cereals. Fortified frozen waffles. Vegetables Turnip greens. Broccoli. Fruits Fortified orange juice. Meats and Other Protein Sources Canned sardines with bones. Canned salmon with bones. Soy beans. Tofu. Baked beans. Almonds. [place] nuts. Sunflower seeds. Dairy Milk. Yogurt. Cheese. Cottage cheese. Beverages Fortified soy milk. Fortified rice milk. Sweets/Desserts Pudding. Ice Cream. Milkshakes. Blackstrap molasses. The items listed above may not be a complete list of recommended foods or beverages. Contact your dietitian for more options. What foods can affect my calcium intake? It may be more difficult for your body to use calcium or calcium may leave your body more quickly if you consume large  amounts of:  Sodium.  Protein.  Caffeine.  Alcohol.  This information is not intended to replace advice given to you by your health care provider. Make sure you discuss any questions you have with your health care provider. Document Released: 06/06/2004 Document Revised: 05/12/2016 Document Reviewed: 03/31/2014 Elsevier Interactive Patient  Education  2018 [employer].

## 2020-09-03 LAB — COMPLETE METABOLIC PANEL WITH GFR
AG Ratio: 1.7 (calc) (ref 1.0–2.5)
ALT: 15 U/L (ref 6–29)
AST: 23 U/L (ref 10–35)
Albumin: 4.5 g/dL (ref 3.6–5.1)
Alkaline phosphatase (APISO): 73 U/L (ref 37–153)
BUN: 12 mg/dL (ref 7–25)
CO2: 30 mmol/L (ref 20–32)
Calcium: 9.5 mg/dL (ref 8.6–10.4)
Chloride: 100 mmol/L (ref 98–110)
Creat: 0.66 mg/dL (ref 0.50–0.99)
GFR, Est African American: 107 mL/min/{1.73_m2} (ref >=60)
GFR, Est Non African American: 92 mL/min/{1.73_m2} (ref >=60)
Globulin: 2.6 g/dL (calc) (ref 1.9–3.7)
Glucose, Bld: 85 mg/dL (ref 65–99)
Potassium: 4 mmol/L (ref 3.5–5.3)
Sodium: 139 mmol/L (ref 135–146)
Total Bilirubin: 0.6 mg/dL (ref 0.2–1.2)
Total Protein: 7.1 g/dL (ref 6.1–8.1)

## 2020-09-03 LAB — CBC WITH DIFFERENTIAL/PLATELET
Absolute Monocytes: 379 cells/uL (ref 200–950)
Basophils Absolute: 38 cells/uL (ref 0–200)
Basophils Relative: 0.8 %
Eosinophils Absolute: 110 cells/uL (ref 15–500)
Eosinophils Relative: 2.3 %
HCT: 44.9 % (ref 35.0–45.0)
Hemoglobin: 14.8 g/dL (ref 11.7–15.5)
Lymphs Abs: 1608 cells/uL (ref 850–3900)
MCH: 30.7 pg (ref 27.0–33.0)
MCHC: 33 g/dL (ref 32.0–36.0)
MCV: 93.2 fL (ref 80.0–100.0)
MPV: 10.6 fL (ref 7.5–12.5)
Monocytes Relative: 7.9 %
Neutro Abs: 2664 cells/uL (ref 1500–7800)
Neutrophils Relative %: 55.5 %
Platelets: 261 10*3/uL (ref 140–400)
RBC: 4.82 10*6/uL (ref 3.80–5.10)
RDW: 11.9 % (ref 11.0–15.0)
Total Lymphocyte: 33.5 %
WBC: 4.8 10*3/uL (ref 3.8–10.8)

## 2020-09-03 LAB — LIPID PANEL
Cholesterol: 211 mg/dL — ABNORMAL HIGH (ref <200)
HDL: 76 mg/dL (ref >=50)
LDL Cholesterol (Calc): 116 mg/dL (calc) — ABNORMAL HIGH
Non-HDL Cholesterol (Calc): 135 mg/dL (calc) — ABNORMAL HIGH (ref <130)
Total CHOL/HDL Ratio: 2.8 (calc) (ref <5.0)
Triglycerides: 93 mg/dL (ref <150)

## 2020-09-03 LAB — TSH: TSH: 1.78 mIU/L (ref 0.40–4.50)

## 2020-09-06 LAB — CYTOLOGY - PAP
Comment: NEGATIVE
Diagnosis: NEGATIVE
High risk HPV: NEGATIVE

## 2020-09-15 ENCOUNTER — Telehealth: Payer: Self-pay

## 2020-09-15 NOTE — Telephone Encounter (Signed)
Patient called in asking for Shawna Bass, advised that she was not in office, could take a message. Shawna Bass states she was billed for a preventive bone density scan that was done back in May,has called billing and has also spoke with Shawna Bass.  Shawna Bass was told that Shawna Bass will have the correct code submitted to the coding department and would receive a call back within a few days but it has been more than a few days and is getting a little worried because the bill is pass due for payment.   She has contacted her insurance company a couple of days ago who told her that they have no update, and that Shawna Bass is still responsible for the bill. Insurance suggested that she call us back to see if we could submit to them office notes that states she is going to have a preventive scan done, their fax number is [phone]

## 2020-09-17 NOTE — Telephone Encounter (Signed)
Spoke with Shawna Bass and she advised me that her insurance company has not received a corrected claim from the imaging center. I called the imaging center and spoke with Marylene Land who advised me that she would resubmit the claim via fax to [id number]. This was the fax number Shawna Sweigert provided [facility] ). Will forward this to [facility] for review as well. The patient advised that the insurance company said they would review the claim if we sent notes to support the preventative code.

## 2020-09-19 NOTE — Telephone Encounter (Signed)
Shawna Bass,   As far as I can see, we sent the may mammogram with the screening diagnosis for breat cancer screen. We can discuss this further tomorrow, just let me know a good time.  Thank you, Dawn

## 2020-10-12 NOTE — Telephone Encounter (Signed)
Shawna Bass called me again letting me know that her insurance company states that they did resubmit the claim, but are still not entering the correct diagnosis code for screening. I called the imaging facility and they assured me that they billed it with the screening code. I will contact the insurance company to see what needs to be done to resolve this issue.

## 2020-10-12 NOTE — Telephone Encounter (Signed)
The screening code for osteoporosis is Z13.820, did they send the claim corrected?   So hard to know, since I can't see anything in epic. Let me know. Thanks, [employer]

## 2020-10-21 ENCOUNTER — Encounter: Payer: Self-pay | Admitting: Family Medicine

## 2020-10-21 ENCOUNTER — Telehealth: Payer: Self-pay

## 2020-10-21 NOTE — Telephone Encounter (Signed)
Pt is following up on this issue again.

## 2020-10-21 NOTE — Telephone Encounter (Signed)
Pt called following up on a billing issue that she states she has been dealing with for 7 months. States she is waiting to hear from [facility]. Pt was quite frustrated.

## 2020-11-11 ENCOUNTER — Encounter: Payer: Self-pay | Admitting: Family Medicine

## 2020-11-24 ENCOUNTER — Encounter: Payer: Self-pay | Admitting: Family Medicine

## 2020-11-25 ENCOUNTER — Encounter: Payer: Self-pay | Admitting: Family Medicine

## 2020-11-25 NOTE — Telephone Encounter (Signed)
Pt is following up on this. The date is incorrect in the letter.

## 2021-05-20 ENCOUNTER — Other Ambulatory Visit: Payer: Self-pay | Admitting: Family Medicine

## 2021-05-20 DIAGNOSIS — Z1231 Encounter for screening mammogram for malignant neoplasm of breast: Secondary | ICD-10-CM

## 2021-06-08 ENCOUNTER — Other Ambulatory Visit: Payer: Self-pay

## 2021-06-08 ENCOUNTER — Ambulatory Visit
Admission: RE | Admit: 2021-06-08 | Discharge: 2021-06-08 | Disposition: A | Discharge status: Home / Self Care | Payer: Commercial Managed Care - PPO | Source: Ambulatory Visit | Attending: Family Medicine | Admitting: Family Medicine

## 2021-06-08 DIAGNOSIS — Z1231 Encounter for screening mammogram for malignant neoplasm of breast: Secondary | ICD-10-CM

## 2022-04-05 ENCOUNTER — Other Ambulatory Visit: Payer: Self-pay | Admitting: Family Medicine

## 2022-04-05 DIAGNOSIS — M810 Age-related osteoporosis without current pathological fracture: Secondary | ICD-10-CM

## 2022-04-10 ENCOUNTER — Ambulatory Visit: Payer: Commercial Managed Care - PPO | Admitting: Cardiology

## 2022-04-28 ENCOUNTER — Other Ambulatory Visit: Payer: Self-pay | Admitting: Family Medicine

## 2022-04-28 DIAGNOSIS — Z1231 Encounter for screening mammogram for malignant neoplasm of breast: Secondary | ICD-10-CM

## 2022-06-27 ENCOUNTER — Ambulatory Visit
Admission: RE | Admit: 2022-06-27 | Discharge: 2022-06-27 | Disposition: A | Discharge status: Home / Self Care | Payer: Medicare Other | Source: Ambulatory Visit | Attending: Family Medicine | Admitting: Family Medicine

## 2022-06-27 DIAGNOSIS — Z1231 Encounter for screening mammogram for malignant neoplasm of breast: Secondary | ICD-10-CM

## 2022-08-28 ENCOUNTER — Ambulatory Visit: Payer: Medicare Other | Admitting: Neurology

## 2022-09-14 ENCOUNTER — Other Ambulatory Visit: Payer: Self-pay

## 2022-09-26 ENCOUNTER — Ambulatory Visit (INDEPENDENT_AMBULATORY_CARE_PROVIDER_SITE_OTHER): Payer: Medicare Other | Admitting: Neurology

## 2022-09-26 ENCOUNTER — Encounter: Payer: Self-pay | Admitting: Neurology

## 2022-09-26 VITALS — BP 157/92 | HR 86 | Ht 65.0 in | Wt 140.6 lb

## 2022-09-26 DIAGNOSIS — Z7189 Other specified counseling: Secondary | ICD-10-CM

## 2022-09-26 DIAGNOSIS — G43109 Migraine with aura, not intractable, without status migrainosus: Secondary | ICD-10-CM

## 2022-09-26 DIAGNOSIS — I6381 Other cerebral infarction due to occlusion or stenosis of small artery: Secondary | ICD-10-CM | POA: Diagnosis not present

## 2022-09-26 DIAGNOSIS — E559 Vitamin D deficiency, unspecified: Secondary | ICD-10-CM

## 2022-09-26 DIAGNOSIS — R413 Other amnesia: Secondary | ICD-10-CM

## 2022-09-26 DIAGNOSIS — I679 Cerebrovascular disease, unspecified: Secondary | ICD-10-CM

## 2022-09-26 NOTE — Patient Instructions (Addendum)
Bloodwork Consider daily asa 81mg  Would consider low dose lipitor for ldl 116 Can consider formal neurocognitive testing as a baseline - she will consider [facility] healthy brain study XX brain by [facility] [employer]   Memory Compensation Strategies  Use "WARM" strategy.  W= write it down  A= associate it  R= repeat it  M= make a mental note  2.   You can keep a Girard Cooter.  Use a 3-ring notebook with sections for the following: calendar, important names and phone numbers,  medications, doctors' names/phone numbers, lists/reminders, and a section to journal what you did  each day.   3.    Use a calendar to write appointments down.  4.    Write yourself a schedule for the day.  This can be placed on the calendar or in a separate section of the Memory Notebook.  Keeping a  regular schedule can help memory.  5.    Use medication organizer with sections for each day or morning/evening pills.  You may need help loading it  6.    Keep a basket, or pegboard by the door.  Place items that you need to take out with you in the basket or on the pegboard.  You may also want to  include a message board for reminders.  7.    Use sticky notes.  Place sticky notes with reminders in a place where the task is performed.  For example: " turn off the  stove" placed by the stove, "lock the door" placed on the door at eye level, " take your medications" on  the bathroom mirror or by the place where you normally take your medications.  8.    Use alarms/timers.  Use while cooking to remind yourself to check on food or as a reminder to take your medicine, or as a  reminder to make a call, or as a reminder to perform another task, etc.

## 2022-09-26 NOTE — Progress Notes (Signed)
[facility]    Provider:  Dr Lucia Gaskins Requesting Provider: Jarrett Soho, PA-C Primary Care Provider:  Jarrett Soho, PA-C  CC:  Memory loss.   HPI:  Shawna Bass is a 66 y.o. female here as requested by Jarrett Soho, PA-C for memory loss. PMHx chronic vertigo.  She is here alone. She reports she Peforms all her own ADLs, IADLS, manages finances, taxes, had vision changes strictly visual at that time diagnosed with ocular migraines described as Sparkles, reprrducible, reversible, happened in the morning, 3x since last September, extesive family hx of migraine with aura, went to ophthalmology, followed by a mild headache, over a year ago. She is having short-term memory loss, can;t remember what she just read. She has a Event organiser. Has not progressed.. started about 2 years ago. No FHx of dementia. More recall and short term. No other focal neurologic deficits, associated symptoms, inciting events or modifiable factors.discussed dementia answered alll questions.    Reviewed notes, labs and imaging from outside physicians, which showed: reviewed imaging with patient  IMPRESSION: 1.  No acute intracranial infarction or hemorrhage.    Electronically Signed by: Nunzio Cory on 07/17/2021 4:10 AM Narrative  This result has an attachment that is not available. COMPARISON: CT head without contrast 1 hour prior   INDICATION: TIA symptoms   TECHNIQUE:  Multiplanar, multisequence MR imaging was performed (MRI HEAD WO IV CONTRAST). CONTRAST: None.  FINDINGS: #  No acute intracranial infarction. #  No acute intracranial hemorrhage. #  No masses, mass effect, midline shift or hydrocephalus. #  Pituitary gland, brainstem, and craniocervical junction are unremarkable. #  Main intracranial flow-voids are patent. #  Orbits and globes are unremarkable. #  Multiple T2 white matter hyperintensities bilaterally compatible with chronic small vessel ischemic  disease.  Review of Systems: Patient complains of symptoms per HPI as well as the following symptoms ocular migraine. Pertinent negatives and positives per HPI. All others negative.   Social History   Socioeconomic History   Marital status: Married    Spouse name: Not on file   Number of children: Not on file   Years of education: Not on file   Highest education level: Not on file  Occupational History   Not on file  Tobacco Use   Smoking status: Never   Smokeless tobacco: Never  Vaping Use   Vaping Use: Never used  Substance and Sexual Activity   Alcohol use: Yes    Alcohol/week: 8.0 - 10.0 standard drinks of alcohol    Types: 8 - 10 Standard drinks or equivalent per week   Drug use: No   Sexual activity: Not on file  Other Topics Concern   Not on file  Social History Narrative   Caffiene 1-2 cups coffee   No sodas   Education masters degre   Working retired: counseling.   Social Determinants of Health   Financial Resource Strain: Not on file  Food Insecurity: Not on file  Transportation Needs: Not on file  Physical Activity: Not on file  Stress: Not on file  Social Connections: Not on file  Intimate Partner Violence: Not on file    Family History  Problem Relation Age of Onset   Stroke Mother    Heart disease Father    Throat cancer Father    Healthy Daughter    Healthy Son    Breast cancer Neg Hx     Past Medical History:  Diagnosis Date   BPPV (benign paroxysmal positional vertigo)  Cataract    External hemorrhoids 04/22/2019   Impingement syndrome of left shoulder 04/22/2019   2017, Dr. Onnie Graham   Osteoporosis 04/01/2020   Dexa 03/2020 T = -2.8 femur, -1.9 spine. New dx: rec OV to discuss options of treatment.     Patient Active Problem List   Diagnosis Date Noted   Memory loss 10/01/2022   Ocular migraine 10/01/2022   white matter disease brain mild normal for age or better ([place]) 10/01/2022   Osteoporosis 04/01/2020   Chronic vertigo  04/22/2019   Impingement syndrome of left shoulder 04/22/2019   External hemorrhoids 04/22/2019    Past Surgical History:  Procedure Laterality Date   NO PAST SURGERIES      Current Outpatient Medications  Medication Sig Dispense Refill   Calcium 280 MG TABS Take one table 3 x week     Multiple Vitamin (MULTIVITAMIN) tablet Take by mouth.     Omega-3 Fatty Acids (FISH OIL) 1200 MG CAPS Take 1 capsule by mouth daily.     Polyethylene Glycol 3350 (MIRALAX PO) Take by mouth daily.      No current facility-administered medications for this visit.    Allergies as of 09/26/2022 - Review Complete 09/26/2022  Allergen Reaction Noted   Sulfa antibiotics  09/02/2020    Vitals: BP (!) 157/92   Pulse 86   Ht 5\' 5"  (1.651 m)   Wt 140 lb 9.6 oz (63.8 kg)   BMI 23.40 kg/m  Last Weight:  Wt Readings from Last 1 Encounters:  09/26/22 140 lb 9.6 oz (63.8 kg)   Last Height:   Ht Readings from Last 1 Encounters:  09/26/22 5\' 5"  (1.651 m)     Physical exam: Exam: Gen: NAD, conversant, well nourised, obese, well groomed                     CV: RRR, no MRG. No Carotid Bruits. No peripheral edema, warm, nontender Eyes: Conjunctivae clear without exudates or hemorrhage  Neuro: Detailed Neurologic Exam  Speech:    Speech is normal; fluent and spontaneous with normal comprehension.  Cognition:     09/26/2022    9:59 AM  Montreal Cognitive Assessment   Visuospatial/ Executive (0/5) 5  Naming (0/3) 3  Attention: Read list of digits (0/2) 2  Attention: Read list of letters (0/1) 1  Attention: Serial 7 subtraction starting at 100 (0/3) 3  Language: Repeat phrase (0/2) 2  Language : Fluency (0/1) 1  Abstraction (0/2) 2  Delayed Recall (0/5) 5  Orientation (0/6) 6  Total 30      09/26/2022    9:58 AM  MMSE - Mini Mental State Exam  Orientation to time 5  Orientation to Place 5  Registration 3  Attention/ Calculation 5  Recall 3  Language- name 2 objects 2  Language-  repeat 1  Language- follow 3 step command 3  Language- read & follow direction 1  Write a sentence 1  Copy design 1  Total score 30         The patient is oriented to person, place, and time;     recent and remote memory intact;     language fluent;     normal attention, concentration,     fund of knowledge Cranial Nerves:    The pupils are equal, round, and reactive to light. The fundi are flat. Visual fields are full to finger confrontation. Extraocular movements are intact. Trigeminal sensation is intact and the muscles of mastication are  normal. The face is symmetric. The palate elevates in the midline. Hearing intact. Voice is normal. Shoulder shrug is normal. The tongue has normal motion without fasciculations.   Coordination:    Normal=  Gait:     normal.   Motor Observation:    No asymmetry, no atrophy, and no involuntary movements noted. Tone:    Normal muscle tone.    Posture:    Posture is normal. normal erect    Strength:    Strength is V/V in the upper and lower limbs.      Sensation: intact to LT     Reflex Exam:  DTR's:    Deep tendon reflexes in the upper and lower extremities are symmetrical bilaterally.   Toes:    The toes are downgoing bilaterally.   Clonus:    Clonus is absent.    Assessment/Plan:  Patient here for memory changes. MMSE and MoCA both 30/30. I do not appreciate any neurodegenerative disease. MRI of the brain in 2022 was normal for age. We discussed the following workup including:  Bloodwork today Consider daily asa 81mg  for stroke prevention - talk to pcp Would consider low dose lipitor for ldl 116 - talk to pcp Can consider formal neurocognitive testing as a baseline - she will consider [place] healthy brain study - discussed and encouraged to join XX brain by Luna Fuse - discussed and encouraged to read [place]- discussed and encouraged to follow Can continue to follow   Orders Placed This Encounter   Procedures   B12 and Folate Panel   Methylmalonic acid, serum   Vitamin B1   TSH Rfx on Abnormal to Free T4   Homocysteine   Vitamin D, 25-hydroxy   No orders of the defined types were placed in this encounter.   Cc: Marda Stalker, PA-C,  Marda Stalker, [place]  Sarina Ill, MD  [facility] [address] [place], [place] [address]  Phone [phone] Fax [phone]  I spent over 60 minutes of face-to-face and non-face-to-face time with patient on the  1. subjective memory loss   2. white matter disease brain mild normal for age or better ([place])   3. Ocular migraine   4. Cerebrovascular disease consultation   5. Vitamin D deficiency    diagnosis.  This included previsit chart review, lab review, study review, order entry, electronic health record documentation, patient education on the different diagnostic and therapeutic options, counseling and coordination of care, risks and benefits of management, compliance, or risk factor reduction

## 2022-10-01 ENCOUNTER — Encounter: Payer: Self-pay | Admitting: Neurology

## 2022-10-01 DIAGNOSIS — R413 Other amnesia: Secondary | ICD-10-CM | POA: Insufficient documentation

## 2022-10-01 DIAGNOSIS — G43109 Migraine with aura, not intractable, without status migrainosus: Secondary | ICD-10-CM | POA: Insufficient documentation

## 2022-10-01 DIAGNOSIS — I6381 Other cerebral infarction due to occlusion or stenosis of small artery: Secondary | ICD-10-CM | POA: Insufficient documentation

## 2022-10-01 LAB — TSH RFX ON ABNORMAL TO FREE T4: TSH: 1.65 u[IU]/mL (ref 0.450–4.500)

## 2022-10-01 LAB — B12 AND FOLATE PANEL
Folate: >20 ng/mL (ref >3.0)
Vitamin B-12: 1091 pg/mL (ref 232–1245)

## 2022-10-01 LAB — METHYLMALONIC ACID, SERUM: Methylmalonic Acid: 180 nmol/L (ref 0–378)

## 2022-10-01 LAB — VITAMIN B1: Thiamine: 161 nmol/L (ref 66.5–200.0)

## 2022-10-01 LAB — HOMOCYSTEINE: Homocysteine: 6.6 umol/L (ref 0.0–17.2)

## 2022-10-01 LAB — VITAMIN D 25 HYDROXY (VIT D DEFICIENCY, FRACTURES): Vit D, 25-Hydroxy: 58 ng/mL (ref 30.0–100.0)

## 2022-10-10 ENCOUNTER — Other Ambulatory Visit: Payer: Self-pay | Admitting: Gastroenterology

## 2022-10-10 DIAGNOSIS — R1310 Dysphagia, unspecified: Secondary | ICD-10-CM

## 2022-10-16 ENCOUNTER — Ambulatory Visit
Admission: RE | Admit: 2022-10-16 | Discharge: 2022-10-16 | Disposition: A | Discharge status: Home / Self Care | Payer: Medicare Other | Source: Ambulatory Visit | Attending: Gastroenterology | Admitting: Gastroenterology

## 2022-10-16 DIAGNOSIS — R1310 Dysphagia, unspecified: Secondary | ICD-10-CM

## 2023-05-16 ENCOUNTER — Other Ambulatory Visit: Payer: Self-pay | Admitting: Family Medicine

## 2023-05-16 DIAGNOSIS — Z1231 Encounter for screening mammogram for malignant neoplasm of breast: Secondary | ICD-10-CM

## 2023-07-02 ENCOUNTER — Ambulatory Visit: Payer: Medicare Other

## 2023-07-03 ENCOUNTER — Ambulatory Visit
Admission: RE | Admit: 2023-07-03 | Discharge: 2023-07-03 | Disposition: A | Discharge status: Home / Self Care | Payer: Medicare Other | Source: Ambulatory Visit | Attending: Family Medicine | Admitting: Family Medicine

## 2023-07-03 DIAGNOSIS — Z1231 Encounter for screening mammogram for malignant neoplasm of breast: Secondary | ICD-10-CM

## 2023-08-20 ENCOUNTER — Other Ambulatory Visit: Payer: Self-pay | Admitting: Family Medicine

## 2023-08-20 DIAGNOSIS — M810 Age-related osteoporosis without current pathological fracture: Secondary | ICD-10-CM

## 2023-09-04 ENCOUNTER — Other Ambulatory Visit: Payer: Self-pay | Admitting: Medical Genetics

## 2023-09-04 DIAGNOSIS — Z006 Encounter for examination for normal comparison and control in clinical research program: Secondary | ICD-10-CM

## 2023-10-02 ENCOUNTER — Other Ambulatory Visit (HOSPITAL_COMMUNITY)
Admission: RE | Admit: 2023-10-02 | Discharge: 2023-10-02 | Disposition: A | Discharge status: Home / Self Care | Payer: Medicare Other | Source: Ambulatory Visit | Attending: Medical Genetics | Admitting: Medical Genetics

## 2023-10-02 DIAGNOSIS — Z006 Encounter for examination for normal comparison and control in clinical research program: Secondary | ICD-10-CM | POA: Insufficient documentation

## 2023-10-14 LAB — GENECONNECT MOLECULAR SCREEN: Genetic Analysis Overall Interpretation: NEGATIVE

## 2024-04-21 ENCOUNTER — Other Ambulatory Visit: Payer: Medicare Other

## 2024-05-30 ENCOUNTER — Other Ambulatory Visit: Payer: Self-pay | Admitting: Family Medicine

## 2024-05-30 DIAGNOSIS — Z1231 Encounter for screening mammogram for malignant neoplasm of breast: Secondary | ICD-10-CM

## 2024-07-08 ENCOUNTER — Ambulatory Visit
Admission: RE | Admit: 2024-07-08 | Discharge: 2024-07-08 | Disposition: A | Discharge status: Home / Self Care | Source: Ambulatory Visit | Attending: Family Medicine | Admitting: Family Medicine

## 2024-07-08 DIAGNOSIS — Z1231 Encounter for screening mammogram for malignant neoplasm of breast: Secondary | ICD-10-CM

## 2024-08-06 ENCOUNTER — Encounter: Payer: Self-pay | Admitting: Physical Therapy

## 2024-08-06 ENCOUNTER — Ambulatory Visit: Admitting: Physical Therapy

## 2024-08-06 ENCOUNTER — Ambulatory Visit (HOSPITAL_BASED_OUTPATIENT_CLINIC_OR_DEPARTMENT_OTHER)

## 2024-08-06 ENCOUNTER — Ambulatory Visit (INDEPENDENT_AMBULATORY_CARE_PROVIDER_SITE_OTHER): Admitting: Orthopaedic Surgery

## 2024-08-06 DIAGNOSIS — G8929 Other chronic pain: Secondary | ICD-10-CM

## 2024-08-06 DIAGNOSIS — M25512 Pain in left shoulder: Secondary | ICD-10-CM

## 2024-08-06 DIAGNOSIS — M25562 Pain in left knee: Secondary | ICD-10-CM | POA: Diagnosis not present

## 2024-08-06 DIAGNOSIS — M7542 Impingement syndrome of left shoulder: Secondary | ICD-10-CM

## 2024-08-06 NOTE — Therapy (Signed)
  OUTPATIENT PHYSICAL THERAPY SCREEN @[address]   Patient Name: Shawna Bass MRN: [id number] DOB:1956-04-19, 68 y.o., female Today's Date: 08/06/2024  END OF SESSION:  PT End of Session - 08/06/24 0950     Visit Number 1    Activity Tolerance Patient tolerated treatment well    Behavior During Therapy [facility] for tasks assessed/performed          Past Medical History:  Diagnosis Date   BPPV (benign paroxysmal positional vertigo)    Cataract    External hemorrhoids 04/22/2019   Impingement syndrome of left shoulder 04/22/2019   2017, Dr. Melita   Osteoporosis 04/01/2020   Dexa 03/2020 T = -2.8 femur, -1.9 spine. New dx: rec OV to discuss options of treatment.    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   white matter disease brain mild normal for age or better [facility]) 10/01/2022   Osteoporosis 04/01/2020   Chronic vertigo 04/22/2019   Impingement syndrome of left shoulder 04/22/2019   External hemorrhoids 04/22/2019     THERAPY DIAG:  Chronic left shoulder pain  Goal of screen:  This patient was referred to Physical Therapy specialty screen by Elspeth Parker, MD for beginning HEP and assist in beginning POC.    Clinical Impression & Plan:  Pt presents with gross hypermobility in shoulders, increased thoracic kyphosis and forward head posture resulting in RC impingement during exercise. Primary exercise given today is scapular retraction and working to incorporate that into her exercise classes. Pt would like to begin a PT POC but will only be in [place] Mon/Tues/Wed AM. Will send referral to [facility].    Harlene Cordon PT, DPT 08/06/2024, 9:50 AM  [address] [place], [place] [address] [phone]   Note: charges not applied for screen.

## 2024-08-06 NOTE — Progress Notes (Signed)
 Chief Complaint: Left knee, left shoulder pain     History of Present Illness:    Shawna Bass is a 68 y.o. female right-hand-dominant female presents with ongoing left shoulder pain.  She has been having on and off shoulder pain and did see Dr. Melita in 2017 for which a subacromial injection was provided.  She did get very good relief from this.  She has been increasingly having overhead pain for reach particularly as she has been ramping up with a 5 pound weight in her exercise class downstairs at [employer].  With regard to the left knee she has been having chronic pain for approximately 35 years although this has worsened in June.  She has pain with any type of pivoting or lateral type motion.  She is not having pain on the stairs.  She is very active.  She is experiencing pain predominately about the medial joint line about the left knee.  She does enjoy traveling as well    PMH/PSH/Family History/Social History/Meds/Allergies:    Past Medical History:  Diagnosis Date   BPPV (benign paroxysmal positional vertigo)    Cataract    External hemorrhoids 04/22/2019   Impingement syndrome of left shoulder 04/22/2019   2017, Dr. Melita   Osteoporosis 04/01/2020   Dexa 03/2020 T = -2.8 femur, -1.9 spine. New dx: rec OV to discuss options of treatment.    Past Surgical History:  Procedure Laterality Date   NO PAST SURGERIES     Social History   Socioeconomic History   Marital status: Married    Spouse name: Not on file   Number of children: Not on file   Years of education: Not on file   Highest education level: Not on file  Occupational History   Not on file  Tobacco Use   Smoking status: Never   Smokeless tobacco: Never  Vaping Use   Vaping status: Never Used  Substance and Sexual Activity   Alcohol use: Yes    Alcohol/week: 8.0 - 10.0 standard drinks of alcohol    Types: 8 - 10 Standard drinks or equivalent per week   Drug use: No   Sexual activity: Not on  file  Other Topics Concern   Not on file  Social History Narrative   Caffiene 1-2 cups coffee   No sodas   Education masters degre   Working retired: counseling.   Social Drivers of Corporate investment banker Strain: Not on file  Food Insecurity: Not on file  Transportation Needs: Not on file  Physical Activity: Not on file  Stress: Not on file  Social Connections: Unknown (03/21/2022)   [facility]   Social Network    Social Network: Not on file   Family History  Problem Relation Age of Onset   Stroke Mother    Heart disease Father    Throat cancer Father    Healthy Daughter    Healthy Son    Breast cancer Neg Hx    BRCA 1/2 Neg Hx    Allergies  Allergen Reactions   Sulfa Antibiotics    Current Outpatient Medications  Medication Sig Dispense Refill   Calcium 280 MG TABS Take one table 3 x week     Multiple Vitamin (MULTIVITAMIN) tablet Take by mouth.     Omega-3 Fatty Acids (FISH OIL) 1200 MG CAPS Take 1 capsule by mouth daily.     Polyethylene Glycol 3350 (MIRALAX PO) Take by mouth daily.  No current facility-administered medications for this visit.   No results found.  Review of Systems:   A ROS was performed including pertinent positives and negatives as documented in the HPI.  Physical Exam :   Constitutional: NAD and appears stated age Neurological: Alert and oriented Psych: Appropriate affect and cooperative There were no vitals taken for this visit.   Comprehensive Musculoskeletal Exam:    Left knee with tenderness about medial joint line with positive McMurray.  Range of motion is from -30 to 135 degrees.  No lateral joint line tenderness, negative Lachman no varus or valgus laxity distal neurosensory intact  Left shoulder with lateral radiating pain and positive Neer impingement.  Active forward elevation is 160 degrees bilaterally external rotation at side is 40 degrees bilaterally internal rotation is L1  bilaterally.   Imaging:   Xray (3 views left shoulder, 4 views left knee): Normal left shoulder, normal left knee    I personally reviewed and interpreted the radiographs.   Assessment and Plan:   68 y.o. female with evidence of left knee pain consistent with a possible medial meniscal injury.  I did describe unfortunately that given the chronicity of this issue I would recommend an MRI as there may be an underlying meniscal injury or root injury that may need to be intervened upon.  With regard to the left shoulder I do believe this is likely related to her recent changes in activity.  At this time we will get her connected with physical therapy for some guidance on some strengthening exercises and I did offer her an additional injection which she would like to defer today  -Plan for MRI left knee and follow-up to discuss results   I personally saw and evaluated the patient, and participated in the management and treatment plan.  Elspeth Parker, MD Attending Physician, Orthopedic Surgery  This document was dictated using Dragon voice recognition software. A reasonable attempt at proof reading has been made to minimize errors.

## 2024-08-08 ENCOUNTER — Other Ambulatory Visit (HOSPITAL_BASED_OUTPATIENT_CLINIC_OR_DEPARTMENT_OTHER): Payer: Self-pay | Admitting: Orthopaedic Surgery

## 2024-08-08 DIAGNOSIS — M7542 Impingement syndrome of left shoulder: Secondary | ICD-10-CM

## 2024-08-11 NOTE — Therapy (Signed)
 OUTPATIENT PHYSICAL THERAPY UPPER EXTREMITY EVALUATION   Patient Name: Shawna Bass MRN: [id number] DOB:July 25, 1956, 68 y.o., female Today's Date: 08/12/2024  END OF SESSION:  PT End of Session - 08/12/24 1316     Visit Number 1    Number of Visits 6    Date for Recertification  09/23/24    Authorization Type Medicare    PT Start Time 1200    PT Stop Time 1250    PT Time Calculation (min) 50 min    Activity Tolerance Patient tolerated treatment well    Behavior During Therapy [facility] for tasks assessed/performed          Past Medical History:  Diagnosis Date   BPPV (benign paroxysmal positional vertigo)    Cataract    External hemorrhoids 04/22/2019   Impingement syndrome of left shoulder 04/22/2019   2017, Dr. Melita   Osteoporosis 04/01/2020   Dexa 03/2020 T = -2.8 femur, -1.9 spine. New dx: rec OV to discuss options of treatment.    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   Impingement syndrome of left shoulder 04/22/2019   External hemorrhoids 04/22/2019    PCP:   REFERRING PROVIDER: Genelle Standing, MD  REFERRING DIAG: Left shoulder impingment  THERAPY DIAG:  Chronic left shoulder pain  Abnormal posture  Rationale for Evaluation and Treatment: Rehabilitation  ONSET DATE: years ago with exacerbation in June 2025  SUBJECTIVE:                                                                                                                                                                                      SUBJECTIVE STATEMENT: My shoulder has gotten so much better. I have had pain for several years. I started an exercise routine at [facility] and I did so much better. About a year  ago I increased to 5 lbs for raises and in June it started hurting a lot. I really decreased all my wts and tried without wts and it still hurt.. At its worst I couldn't close the car door. I stopped doing the classes for 2 months and the shoulder has gotten better. I have Monday and Tues are the best days, because I am in [place] to help my parents those days. I have some pain still every day, but not bad. I started back to 2lb wts the the last 2  weeks. Pain is mainly at the mid deltoid/lateral shoulder Hand dominance: Right  PERTINENT HISTORY: Pt has been having on and off left shoulder pain and saw Dr. Melita in 2017 for an injection with good relief. She is attending [employer] and having increased pain after using a 5 lb wt overhead. Xrays of the shoulder were normal. She is scheduled for an MRI of her left  knee to r/o meniscal tear.  PAIN:  Are you having pain? Yes: NPRS scale: 0/10 best, 2/10 worst Pain location: left shoulder, mid delt Pain description: dull ache Aggravating factors: was closing car door, picking up gallon of mild, overhead activities Relieving factors: doesn't try anything, not reaching overhead  PRECAUTIONS: possible Left knee Meniscal tear, vertigo, osteoporosis  RED FLAGS: None   WEIGHT BEARING RESTRICTIONS: No  FALLS:  Has patient fallen in last 6 months? No  LIVING ENVIRONMENT: Lives with: lives with their spouse Lives in: House/apartment  OCCUPATION: Retired :Counseling  PLOF: Independent  PATIENT GOALS: learn what is the problem and what can she do to strengthen, have better posture etc  NEXT MD VISIT:   OBJECTIVE:  Note: Objective measures were completed at Evaluation unless otherwise noted.  DIAGNOSTIC FINDINGS:  Xrays of left shoulder normal/ left knee normal  PATIENT SURVEYS :  Quick Dash: 29.55%  COGNITION: Overall cognitive status: Within functional limits for tasks assessed     SENSATION: WFL  POSTURE: increased thoracic  kyphosis and forward head posture   UPPER EXTREMITY ROM: + = pain  Active ROM Right eval Left eval  Shoulder flexion 145 152  Shoulder extension    Shoulder abduction 160 165+  Shoulder adduction    Shoulder internal rotation T7 T7  Shoulder external rotation Scap spine Scap spine  Elbow flexion    Elbow extension    Wrist flexion    Wrist extension    Wrist ulnar deviation    Wrist radial deviation    Wrist pronation    Wrist supination    (Blank rows = not tested)  UPPER EXTREMITY MMT:  MMT Right eval Left eval  Shoulder flexion  4+  Shoulder extension    Shoulder abduction  4    +pain  Shoulder adduction    Shoulder internal rotation  5  Shoulder external rotation  4+  Middle trapezius    Lower trapezius    Elbow flexion  5  Elbow extension    Wrist flexion    Wrist extension    Wrist ulnar deviation    Wrist radial deviation    Wrist pronation    Wrist supination    Grip strength (lbs)    (Blank rows = not tested)  SHOULDER SPECIAL TESTS: Impingement tests: Hawkins/Kennedy impingement test: positive  Rotator cuff assessment: Empty can test: positive   Pt demonstrates UT compensation on left with overhead activities    PALPATION:  Non tender  TREATMENT DATE:  08/12/2024 Pt evaluated and educated as below  PATIENT EDUCATION: Education details: importance of posture, POC, LOS, HEP instruction; see education, discussed when she returns to exercise class with music, movement etc importance of focusing on posture, form and not working into painful areas. Discussed scapular depression but needs practice with this Person educated: Patient Education method: Explanation, Demonstration, and handouts Education comprehension: verbalized understanding and returned demonstration  HOME EXERCISE PROGRAM: Scapular retraction B with red band x  10 Shoulder ext with red B x 10 Bilateral ER yellow x 10 Updated HEP with pics and gave theraband  ASSESSMENT:  CLINICAL IMPRESSION: Patient is a 68 y.o. female who was seen today for physical therapy evaluation and treatment for complaints of Left shoulder pain which has been present on and off for years, but which was exacerbated after lifting 5 lb wts overhead at [employer].  Pain has improved to 2/10 since holding off on exercise class.She presents with forward head and increased thoracic kyphosis. She demonstrates positive impingement test, and empty can and had mild left shoulder achiness at completion of evaluation. She will benefit from skilled PT to address deficits and return pt to PLOF.   OBJECTIVE IMPAIRMENTS: decreased activity tolerance, decreased knowledge of condition, decreased strength, impaired UE functional use, postural dysfunction, and pain.   ACTIVITY LIMITATIONS: lifting, reach over head, and home chores  PARTICIPATION LIMITATIONS: does what is required but with intermittent left shoulder pain  PERSONAL FACTORS: Time since onset of injury/illness/exacerbation are also affecting patient's functional outcome.   REHAB POTENTIAL: Good  CLINICAL DECISION MAKING: Stable/uncomplicated  EVALUATION COMPLEXITY: Low  GOALS: Goals reviewed with patient? Yes  SHORT TERM GOALS= LONG TERM GOALS: Target date: 09/23/2024  Pt will be independent with a HEP to improve left shoulder/scapular strength/stabilization Baseline: Goal status: INITIAL  2.  Pt will have decreased left shoulder pain by 50% overall to demonstrate improved function Baseline:  Goal status: INITIAL  3.  Pts quick dash will improve to no greater than 15% Baseline:  Goal status: INITIAL  PT FREQUENCY: 1x/week  PT DURATION: 6 weeks  PLANNED INTERVENTIONS: 97164- PT Re-evaluation, 97750- Physical Performance Testing, 97110-Therapeutic exercises, 97530- Therapeutic activity, [id number]- Neuromuscular  re-education, 97535- Self Care, and [id number]- Manual therapy  PLAN FOR NEXT SESSION: Review band exs, thoracic extension in chair with ball, strength, scapular stabs, patient education, update HEP.   Grayce JINNY Sheldon, PT 08/12/2024, 1:17 PM

## 2024-08-12 ENCOUNTER — Other Ambulatory Visit: Payer: Self-pay

## 2024-08-12 ENCOUNTER — Ambulatory Visit: Attending: Orthopaedic Surgery

## 2024-08-12 DIAGNOSIS — R293 Abnormal posture: Secondary | ICD-10-CM | POA: Diagnosis present

## 2024-08-12 DIAGNOSIS — M6281 Muscle weakness (generalized): Secondary | ICD-10-CM | POA: Insufficient documentation

## 2024-08-12 DIAGNOSIS — G8929 Other chronic pain: Secondary | ICD-10-CM | POA: Insufficient documentation

## 2024-08-12 DIAGNOSIS — M7542 Impingement syndrome of left shoulder: Secondary | ICD-10-CM | POA: Diagnosis not present

## 2024-08-12 DIAGNOSIS — M25512 Pain in left shoulder: Secondary | ICD-10-CM | POA: Insufficient documentation

## 2024-08-15 ENCOUNTER — Other Ambulatory Visit (HOSPITAL_BASED_OUTPATIENT_CLINIC_OR_DEPARTMENT_OTHER): Payer: Self-pay | Admitting: Orthopaedic Surgery

## 2024-08-15 DIAGNOSIS — G8929 Other chronic pain: Secondary | ICD-10-CM

## 2024-08-19 ENCOUNTER — Ambulatory Visit

## 2024-08-19 DIAGNOSIS — G8929 Other chronic pain: Secondary | ICD-10-CM

## 2024-08-19 DIAGNOSIS — M6281 Muscle weakness (generalized): Secondary | ICD-10-CM

## 2024-08-19 DIAGNOSIS — R293 Abnormal posture: Secondary | ICD-10-CM

## 2024-08-19 DIAGNOSIS — M25512 Pain in left shoulder: Secondary | ICD-10-CM | POA: Diagnosis not present

## 2024-08-19 NOTE — Therapy (Signed)
 OUTPATIENT PHYSICAL THERAPY UPPER EXTREMITY TREATMENT   Patient Name: Shawna Bass MRN: [id number] DOB:1956/02/26, 68 y.o., female Today's Date: 08/19/2024  END OF SESSION:  PT End of Session - 08/19/24 1104     Visit Number 2    Number of Visits 6    Date for Recertification  09/23/24    Authorization Type Medicare    PT Start Time 1101    PT Stop Time 1156    PT Time Calculation (min) 55 min    Activity Tolerance Patient tolerated treatment well    Behavior During Therapy [facility] for tasks assessed/performed          Past Medical History:  Diagnosis Date   BPPV (benign paroxysmal positional vertigo)    Cataract    External hemorrhoids 04/22/2019   Impingement syndrome of left shoulder 04/22/2019   2017, Dr. Melita   Osteoporosis 04/01/2020   Dexa 03/2020 T = -2.8 femur, -1.9 spine. New dx: rec OV to discuss options of treatment.    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   Impingement syndrome of left shoulder 04/22/2019   External hemorrhoids 04/22/2019    PCP:   REFERRING PROVIDER: Genelle Standing, MD  REFERRING DIAG: Left shoulder impingment  THERAPY DIAG:  Chronic left shoulder pain  Abnormal posture  Muscle weakness (generalized)  Rationale for Evaluation and Treatment: Rehabilitation  ONSET DATE: years ago with exacerbation in June 2025  SUBJECTIVE:                                                                                                                                                                                      SUBJECTIVE STATEMENT: I've been doing the exercises she gave me last time and those are going fine except I have noticed my low back bothering me  after them, maybe I don't  Hand dominance: Right  PERTINENT HISTORY: Pt has been having on and off left shoulder pain and saw Dr. Melita in 2017 for an injection with good relief. She is attending [employer] and having increased pain after using a 5 lb wt overhead. Xrays of the shoulder were normal. She is scheduled for an MRI of her left  knee to r/o meniscal tear.  PAIN:  Are you having pain? Yes: NPRS scale: 1-2/10 Pain location: left shoulder, mid delt Pain description: dull ache Aggravating factors: did exercise  class yesterday Relieving factors: resting arm  PRECAUTIONS: possible Left knee Meniscal tear, vertigo, osteoporosis  RED FLAGS: None   WEIGHT BEARING RESTRICTIONS: No  FALLS:  Has patient fallen in last 6 months? No  LIVING ENVIRONMENT: Lives with: lives with their spouse Lives in: House/apartment  OCCUPATION: Retired :Counseling  PLOF: Independent  PATIENT GOALS: learn what is the problem and what can she do to strengthen, have better posture etc  NEXT MD VISIT:   OBJECTIVE:  Note: Objective measures were completed at Evaluation unless otherwise noted.  DIAGNOSTIC FINDINGS:  Xrays of left shoulder normal/ left knee normal  PATIENT SURVEYS :  Quick Dash: 29.55%  COGNITION: Overall cognitive status: Within functional limits for tasks assessed     SENSATION: WFL  POSTURE: increased thoracic kyphosis and forward head posture   UPPER EXTREMITY ROM: + = pain  Active ROM Right eval Left eval  Shoulder flexion 145 152  Shoulder extension    Shoulder abduction 160 165+  Shoulder adduction    Shoulder internal rotation T7 T7  Shoulder external rotation Scap spine Scap spine  Elbow flexion    Elbow extension    Wrist flexion    Wrist extension    Wrist ulnar deviation    Wrist radial deviation    Wrist pronation    Wrist supination    (Blank rows = not tested)  UPPER EXTREMITY MMT:  MMT Right eval Left eval  Shoulder flexion  4+   Shoulder extension    Shoulder abduction  4    +pain  Shoulder adduction    Shoulder internal rotation  5  Shoulder external rotation  4+  Middle trapezius    Lower trapezius    Elbow flexion  5  Elbow extension    Wrist flexion    Wrist extension    Wrist ulnar deviation    Wrist radial deviation    Wrist pronation    Wrist supination    Grip strength (lbs)    (Blank rows = not tested)  SHOULDER SPECIAL TESTS: Impingement tests: Hawkins/Kennedy impingement test: positive  Rotator cuff assessment: Empty can test: positive   Pt demonstrates UT compensation on left with overhead activities    PALPATION:  Non tender                                                                                                                             TREATMENT DATE:  08/19/24: Therapeutic Exercises and Neuro Re Ed Roll yellow ball up wall with 1# on Lt wrist x 12 returning demo Standing with back against wall working on correct posture and core engagement Free Camera operator for scap retract 3# (unable to pull 7#) x 12 reps, spending time initially focusing on instructing in correct technique and form with core engagement Standing with head/shoulders against wall and core engaged for bil UE 3 way raises 1# flex x 10, scaption x 5 (UE fatigue so stopped) and abd x 10 Therapeutic  Activities  Supine for Meeks Decompression Exercises x 8 each with demo and VC's for correct muscle engagement and to relax other muscles as pt was overcompensating Ppt x 10, with 5 sec holds Self Care Spent a lot of time during session educating pt on importance of correct posture. Also incorporated this instruction into exercises with demo and examples of other times, like with ADLs, that she will want to think about incorporate these new postural tips she is learning today.    08/12/2024 Pt evaluated and educated as below  PATIENT EDUCATION: Education details: Posture education and strength, bil UE strength,  and core strength to support proper posture Person educated: Patient Education method: Explanation, Demonstration, and handouts, VC's Education comprehension: verbalized understanding and returned demonstration, will benefit from further review for reinforcement  HOME EXERCISE PROGRAM: Scapular retraction B with red band x 10 Shoulder ext with red B x 10 Bilateral ER yellow x 10 Updated HEP with pics and gave theraband  08/19/24: Posture education and strength, core strength and bil UE strength  ASSESSMENT:  CLINICAL IMPRESSION: Today spent a lot of time educating pt on importance of proper posture and how to achieve this with multiple activities and positions done today in clinic (I.e. standing vs supine). Progressed HEP to include exercises done today in clinic. She will benefit from further review of these for reinforcement of correct posture and technique. Pt reports feeling very encouraged after session today and, though nervous about remembering everything, is excited to started trying. Encouraged her that the more she/we incorporate these new techniques, the more natural these will become.     OBJECTIVE IMPAIRMENTS: decreased activity tolerance, decreased knowledge of condition, decreased strength, impaired UE functional use, postural dysfunction, and pain.   ACTIVITY LIMITATIONS: lifting, reach over head, and home chores  PARTICIPATION LIMITATIONS: does what is required but with intermittent left shoulder pain  PERSONAL FACTORS: Time since onset of injury/illness/exacerbation are also affecting patient's functional outcome.   REHAB POTENTIAL: Good  CLINICAL DECISION MAKING: Stable/uncomplicated  EVALUATION COMPLEXITY: Low  GOALS: Goals reviewed with patient? Yes  SHORT TERM GOALS= LONG TERM GOALS: Target date: 09/23/2024  Pt will be independent with a HEP to improve left shoulder/scapular strength/stabilization Baseline: Goal status: INITIAL  2.  Pt will have  decreased left shoulder pain by 50% overall to demonstrate improved function Baseline:  Goal status: INITIAL  3.  Pts quick dash will improve to no greater than 15% Baseline:  Goal status: INITIAL  PT FREQUENCY: 1x/week  PT DURATION: 6 weeks  PLANNED INTERVENTIONS: 97164- PT Re-evaluation, 97750- Physical Performance Testing, 97110-Therapeutic exercises, 97530- Therapeutic activity, 97112- Neuromuscular re-education, 97535- Self Care, and 02859- Manual therapy  PLAN FOR NEXT SESSION: Review new HEP and band exs and make sure she is contracting core/not compensating with back, thoracic extension in chair with ball, strength, scapular stabs, cont with patient education, update HEP prn.   Aden Berwyn Caldron, PTA 08/19/2024, 12:33 PM   1. Decompression Exercise     [facility] [phone]    Lie on back on firm surface, knees bent, feet flat, arms turned up, out to sides, backs of hands down. Time _5-15__ minutes. Surface: floor   2. Head Press    Bring cervical spine (neck) into neutral position (by either tucking the chin towards the chest or tilting the chin upward). Feel weight on back of head. Press head downward into supporting surface.    Hold _2-3__ seconds. Repeat _3-5__ times. Do _1-2__ times per day.  3.  Shoulder Press    Start in Decompression Exercise position. Press shoulders downward towards supporting surface. Hold __2-3__ seconds while counting out loud. Repeat _3-5___ times. Do _1-2___ times per day.   4. Leg Lengthener    Straighten one leg. Pull toes AND forefoot toward knee, extend heel. Lengthen leg by pulling pelvis away from ribs. Hold _2-3__ seconds. Relax. Repeat __4-6__ times. Do other leg.  Surface: floor   5. Leg Press    Straighten one leg down to floor keeping leg aligned with hip. Pull toes AND forefoot toward knee; extend heel.  Press entire leg downward (as if pressing leg into sandy beach). DO NOT BEND KNEE. Hold _2-3__  seconds. Do __4-6__ times. Repeat with other leg.   PELVIC TILT: Posterior    Tighten abdominals, flatten low back. __10_ reps per set, __2-3_ sets per day, _5__ seconds hold.  3 Way Raises:      Starting Position:  Leaning against wall, walk feet a few inches away from the wall and make tummy tight (tuck hips underneath you) Press back/shoulders/head against wall as much as possible. Keep thumbs up to ceiling, elbows straight and shoulders relaxed/down throughout.  1. Lift arms in front to shoulder height 2. Lift arms a little wider into a V to shoulder height (45 degrees) 3. Lift arms out to sides in a T to shoulder height (90 degrees)   10 reps each and start every other day for 1-2 weeks, if no increased shoulder pain then can increase to daily.

## 2024-08-26 ENCOUNTER — Encounter: Payer: Self-pay | Admitting: Physical Therapy

## 2024-08-26 ENCOUNTER — Ambulatory Visit: Admitting: Physical Therapy

## 2024-08-26 DIAGNOSIS — M6281 Muscle weakness (generalized): Secondary | ICD-10-CM

## 2024-08-26 DIAGNOSIS — M25512 Pain in left shoulder: Secondary | ICD-10-CM | POA: Diagnosis not present

## 2024-08-26 DIAGNOSIS — R293 Abnormal posture: Secondary | ICD-10-CM

## 2024-08-26 DIAGNOSIS — G8929 Other chronic pain: Secondary | ICD-10-CM

## 2024-08-26 NOTE — Therapy (Signed)
 OUTPATIENT PHYSICAL THERAPY UPPER EXTREMITY TREATMENT   Patient Name: Shawna Bass MRN: [id number] DOB:1956-10-09, 68 y.o., female Today's Date: 08/26/2024  END OF SESSION:  PT End of Session - 08/26/24 1107     Visit Number 3    Number of Visits 6    Date for Recertification  09/23/24    Authorization Type Medicare    PT Start Time 1103    PT Stop Time 1145    PT Time Calculation (min) 42 min    Activity Tolerance Patient tolerated treatment well    Behavior During Therapy [facility] for tasks assessed/performed           Past Medical History:  Diagnosis Date   BPPV (benign paroxysmal positional vertigo)    Cataract    External hemorrhoids 04/22/2019   Impingement syndrome of left shoulder 04/22/2019   2017, Dr. Melita   Osteoporosis 04/01/2020   Dexa 03/2020 T = -2.8 femur, -1.9 spine. New dx: rec OV to discuss options of treatment.    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   Impingement syndrome of left shoulder 04/22/2019   External hemorrhoids 04/22/2019    PCP:   REFERRING PROVIDER: Genelle Standing, MD  REFERRING DIAG: Left shoulder impingment  THERAPY DIAG:  Chronic left shoulder pain  Abnormal posture  Muscle weakness (generalized)  Rationale for Evaluation and Treatment: Rehabilitation  ONSET DATE: years ago with exacerbation in June 2025  SUBJECTIVE:                                                                                                                                                                                      SUBJECTIVE STATEMENT: Went to my [facility] class yesterday and modified what I needed to do and only used 1# weights. Last night my shoulder was  bothering me after the class, but this morning it was fine.   Hand dominance: Right  PERTINENT HISTORY: Pt has been having on and off left shoulder pain and saw Dr. Melita in 2017 for an injection with good relief. She is attending [employer] and having increased pain after using a 5 lb wt overhead. Xrays of the shoulder were normal. She is scheduled for an MRI of her left  knee to r/o meniscal tear.  PAIN:  Are you having pain? Yes: NPRS scale: 1-2/10 Pain location: left shoulder, mid delt Pain  description: dull ache Aggravating factors: did exercise class yesterday Relieving factors: resting arm  PRECAUTIONS: possible Left knee Meniscal tear, vertigo, osteoporosis  RED FLAGS: None   WEIGHT BEARING RESTRICTIONS: No  FALLS:  Has patient fallen in last 6 months? No  LIVING ENVIRONMENT: Lives with: lives with their spouse Lives in: House/apartment  OCCUPATION: Retired :Counseling  PLOF: Independent  PATIENT GOALS: learn what is the problem and what can she do to strengthen, have better posture etc  NEXT MD VISIT:   OBJECTIVE:  Note: Objective measures were completed at Evaluation unless otherwise noted.  DIAGNOSTIC FINDINGS:  Xrays of left shoulder normal/ left knee normal  PATIENT SURVEYS :  Quick Dash: 29.55%  COGNITION: Overall cognitive status: Within functional limits for tasks assessed     SENSATION: WFL  POSTURE: increased thoracic kyphosis and forward head posture   UPPER EXTREMITY ROM: + = pain  Active ROM Right eval Left eval  Shoulder flexion 145 152  Shoulder extension    Shoulder abduction 160 165+  Shoulder adduction    Shoulder internal rotation T7 T7  Shoulder external rotation Scap spine Scap spine  Elbow flexion    Elbow extension    Wrist flexion    Wrist extension    Wrist ulnar deviation    Wrist radial deviation    Wrist pronation    Wrist supination    (Blank rows = not tested)  UPPER EXTREMITY MMT:  MMT Right eval  Left eval  Shoulder flexion  4+  Shoulder extension    Shoulder abduction  4    +pain  Shoulder adduction    Shoulder internal rotation  5  Shoulder external rotation  4+  Middle trapezius    Lower trapezius    Elbow flexion  5  Elbow extension    Wrist flexion    Wrist extension    Wrist ulnar deviation    Wrist radial deviation    Wrist pronation    Wrist supination    Grip strength (lbs)    (Blank rows = not tested)  SHOULDER SPECIAL TESTS: Impingement tests: Hawkins/Kennedy impingement test: positive  Rotator cuff assessment: Empty can test: positive   Pt demonstrates UT compensation on left with overhead activities    PALPATION:  Non tender                                                                                                                             TREATMENT DATE:  08/26/24 Discussion of status x 5 min UBE x 2 min some pain reported so held Rows and ext x 12 ea Blue band 3 ways raises at wall 1# x 10 ea B S/L ER and flex x 10 painful S/L horizontal ABD x 10, then with 1# x 10 Low trap lift off 2x 10 3 way reach with light green band 2x5 B Chin tucks x 10  Ongoing discussion of posture   08/19/24: Therapeutic Exercises and Neuro Re Ed  Roll yellow ball up wall with 1# on Lt wrist x 12 returning demo Standing with back against wall working on correct posture and core engagement Free Camera operator for scap retract 3# (unable to pull 7#) x 12 reps, spending time initially focusing on instructing in correct technique and form with core engagement Standing with head/shoulders against wall and core engaged for bil UE 3 way raises 1# flex x 10, scaption x 5 (UE fatigue so stopped) and abd x 10 Therapeutic Activities  Supine for Meeks Decompression Exercises x 8 each with demo and VC's for correct muscle engagement and to relax other muscles as pt was overcompensating Ppt x 10, with 5 sec holds Self Care Spent a lot of time during session educating pt  on importance of correct posture. Also incorporated this instruction into exercises with demo and examples of other times, like with ADLs, that she will want to think about incorporate these new postural tips she is learning today.    08/12/2024 Pt evaluated and educated as below  PATIENT EDUCATION: Education details: Posture education and strength, bil UE strength, and core strength to support proper posture Person educated: Patient Education method: Explanation, Demonstration, and handouts, VC's Education comprehension: verbalized understanding and returned demonstration, will benefit from further review for reinforcement  HOME EXERCISE PROGRAM: Access Code: [email or web address] URL: [email or web address] Date: 08/26/2024 Prepared by: Mliss  Exercises - Scapular Retraction with Resistance  - 1 x daily - 3 x weekly - 1-3 sets - 10 reps - 2-3 sec hold - Scapular Retraction with Resistance Advanced  - 1 x daily - 3 x weekly - 1-3 sets - 10 reps - Shoulder External Rotation and Scapular Retraction with Resistance  - 1 x daily - 3 x weekly - 1-3 sets - 10 reps - Low Trap Setting at Wall  - 1 x daily - 3 x weekly - 2 sets - 10 reps - Standing Plank on Wall with Reaches and Resistance  - 1 x daily - 3 x weekly - 1-2 sets - 10 reps Updated HEP with pics and gave theraband  08/19/24: Posture education and strength, core strength and bil UE strength  ASSESSMENT:  CLINICAL IMPRESSION: Continued to work on posture. Patient very good at verbalizing what she should do posturally with each exercise. We were able to progress TE with more standing exercises, however she did not tolerate side-lying as well. She will benefit from continued posterior shoulder strengthening to meet LTGs. She continues to demonstrate potential for improvement and would benefit from continued skilled therapy to address impairments.     OBJECTIVE IMPAIRMENTS: decreased activity tolerance, decreased knowledge of  condition, decreased strength, impaired UE functional use, postural dysfunction, and pain.   ACTIVITY LIMITATIONS: lifting, reach over head, and home chores  PARTICIPATION LIMITATIONS: does what is required but with intermittent left shoulder pain  PERSONAL FACTORS: Time since onset of injury/illness/exacerbation are also affecting patient's functional outcome.   REHAB POTENTIAL: Good  CLINICAL DECISION MAKING: Stable/uncomplicated  EVALUATION COMPLEXITY: Low  GOALS: Goals reviewed with patient? Yes  SHORT TERM GOALS= LONG TERM GOALS: Target date: 09/23/2024  Pt will be independent with a HEP to improve left shoulder/scapular strength/stabilization Baseline: Goal status: INITIAL  2.  Pt will have decreased left shoulder pain by 50% overall to demonstrate improved function Baseline:  Goal status: INITIAL  3.  Pts quick dash will improve to no greater than 15% Baseline:  Goal status: INITIAL  PT FREQUENCY: 1x/week  PT DURATION: 6 weeks  PLANNED INTERVENTIONS:  02835- PT Re-evaluation, 97750- Physical Performance Testing, 97110-Therapeutic exercises, 97530- Therapeutic activity, [id number]- Neuromuscular re-education, 97535- Self Care, and 02859- Manual therapy  PLAN FOR NEXT SESSION: Review new HEP and band exs and make sure she is contracting core/not compensating with back, thoracic extension in chair with ball, strength, scapular stabs, cont with patient education, update HEP prn.   Giada Schoppe, PT 08/26/2024, 1:33 PM   1. Decompression Exercise     [facility] [phone]    Lie on back on firm surface, knees bent, feet flat, arms turned up, out to sides, backs of hands down. Time _5-15__ minutes. Surface: floor   2. Head Press    Bring cervical spine (neck) into neutral position (by either tucking the chin towards the chest or tilting the chin upward). Feel weight on back of head. Press head downward into supporting surface.    Hold _2-3__ seconds. Repeat  _3-5__ times. Do _1-2__ times per day.  3. Shoulder Press    Start in Decompression Exercise position. Press shoulders downward towards supporting surface. Hold __2-3__ seconds while counting out loud. Repeat _3-5___ times. Do _1-2___ times per day.   4. Leg Lengthener    Straighten one leg. Pull toes AND forefoot toward knee, extend heel. Lengthen leg by pulling pelvis away from ribs. Hold _2-3__ seconds. Relax. Repeat __4-6__ times. Do other leg.  Surface: floor   5. Leg Press    Straighten one leg down to floor keeping leg aligned with hip. Pull toes AND forefoot toward knee; extend heel.  Press entire leg downward (as if pressing leg into sandy beach). DO NOT BEND KNEE. Hold _2-3__ seconds. Do __4-6__ times. Repeat with other leg.   PELVIC TILT: Posterior    Tighten abdominals, flatten low back. __10_ reps per set, __2-3_ sets per day, _5__ seconds hold.  3 Way Raises:      Starting Position:  Leaning against wall, walk feet a few inches away from the wall and make tummy tight (tuck hips underneath you) Press back/shoulders/head against wall as much as possible. Keep thumbs up to ceiling, elbows straight and shoulders relaxed/down throughout.  1. Lift arms in front to shoulder height 2. Lift arms a little wider into a V to shoulder height (45 degrees) 3. Lift arms out to sides in a T to shoulder height (90 degrees)   10 reps each and start every other day for 1-2 weeks, if no increased shoulder pain then can increase to daily.

## 2024-09-01 ENCOUNTER — Ambulatory Visit
Admission: RE | Admit: 2024-09-01 | Discharge: 2024-09-01 | Disposition: A | Discharge status: Home / Self Care | Source: Ambulatory Visit | Attending: Orthopaedic Surgery | Admitting: Orthopaedic Surgery

## 2024-09-01 DIAGNOSIS — G8929 Other chronic pain: Secondary | ICD-10-CM

## 2024-09-02 ENCOUNTER — Ambulatory Visit

## 2024-09-02 DIAGNOSIS — M25512 Pain in left shoulder: Secondary | ICD-10-CM | POA: Diagnosis not present

## 2024-09-02 DIAGNOSIS — M6281 Muscle weakness (generalized): Secondary | ICD-10-CM

## 2024-09-02 DIAGNOSIS — R293 Abnormal posture: Secondary | ICD-10-CM

## 2024-09-02 DIAGNOSIS — G8929 Other chronic pain: Secondary | ICD-10-CM

## 2024-09-02 NOTE — Therapy (Signed)
 OUTPATIENT PHYSICAL THERAPY UPPER EXTREMITY TREATMENT   Patient Name: Shawna Bass MRN: [id number] DOB:05/01/1956, 68 y.o., female Today's Date: 09/02/2024  END OF SESSION:  PT End of Session - 09/02/24 1106     Visit Number 4    Number of Visits 6    Date for Recertification  09/23/24    Authorization Type Medicare    PT Start Time 1100    PT Stop Time 1150    PT Time Calculation (min) 50 min    Activity Tolerance Patient tolerated treatment well    Behavior During Therapy [facility] for tasks assessed/performed           Past Medical History:  Diagnosis Date   BPPV (benign paroxysmal positional vertigo)    Cataract    External hemorrhoids 04/22/2019   Impingement syndrome of left shoulder 04/22/2019   2017, Dr. Melita   Osteoporosis 04/01/2020   Dexa 03/2020 T = -2.8 femur, -1.9 spine. New dx: rec OV to discuss options of treatment.    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   Impingement syndrome of left shoulder 04/22/2019   External hemorrhoids 04/22/2019    PCP:   REFERRING PROVIDER: Genelle Standing, MD  REFERRING DIAG: Left shoulder impingment  THERAPY DIAG:  Chronic left shoulder pain  Abnormal posture  Muscle weakness (generalized)  Rationale for Evaluation and Treatment: Rehabilitation  ONSET DATE: years ago with exacerbation in June 2025  SUBJECTIVE:                                                                                                                                                                                      SUBJECTIVE STATEMENT: I am noticing how I raise my arm and I'm trying to adjust my technique. It's surprising how often I am noticing it now. I  am also more aware when I do my exercise classes of how I use my arm. I was able to do the whole class yesterday but did have to stop using my arms at one point because I was starting to feel pain, so just continued working my legs.  Hand dominance: Right  PERTINENT HISTORY: Pt has been having on and off left shoulder pain and saw Dr. Melita in 2017 for an injection with good relief. She is attending [employer] and having increased pain after using a 5 lb wt overhead.  Xrays of the shoulder were normal. She is scheduled for an MRI of her left  knee to r/o meniscal tear.  PAIN:  Are you having pain? No, just aware it's not completely gone  PRECAUTIONS: possible Left knee Meniscal tear, vertigo, osteoporosis  RED FLAGS: None   WEIGHT BEARING RESTRICTIONS: No  FALLS:  Has patient fallen in last 6 months? No  LIVING ENVIRONMENT: Lives with: lives with their spouse Lives in: House/apartment  OCCUPATION: Retired :Counseling  PLOF: Independent  PATIENT GOALS: learn what is the problem and what can she do to strengthen, have better posture etc  NEXT MD VISIT:   OBJECTIVE:  Note: Objective measures were completed at Evaluation unless otherwise noted.  DIAGNOSTIC FINDINGS:  Xrays of left shoulder normal/ left knee normal  PATIENT SURVEYS :  Quick Dash: 29.55%  COGNITION: Overall cognitive status: Within functional limits for tasks assessed     SENSATION: WFL  POSTURE: increased thoracic kyphosis and forward head posture   UPPER EXTREMITY ROM: + = pain  Active ROM Right eval Left eval  Shoulder flexion 145 152  Shoulder extension    Shoulder abduction 160 165+  Shoulder adduction    Shoulder internal rotation T7 T7  Shoulder external rotation Scap spine Scap spine  Elbow flexion    Elbow extension    Wrist flexion    Wrist extension    Wrist ulnar deviation    Wrist radial deviation    Wrist pronation    Wrist supination    (Blank rows = not tested)  UPPER  EXTREMITY MMT:  MMT Right eval Left eval  Shoulder flexion  4+  Shoulder extension    Shoulder abduction  4    +pain  Shoulder adduction    Shoulder internal rotation  5  Shoulder external rotation  4+  Middle trapezius    Lower trapezius    Elbow flexion  5  Elbow extension    Wrist flexion    Wrist extension    Wrist ulnar deviation    Wrist radial deviation    Wrist pronation    Wrist supination    Grip strength (lbs)    (Blank rows = not tested)  SHOULDER SPECIAL TESTS: Impingement tests: Hawkins/Kennedy impingement test: positive  Rotator cuff assessment: Empty can test: positive   Pt demonstrates UT compensation on left with overhead activities    PALPATION:  Non tender                                                                                                                             TREATMENT DATE:  09/02/24: Therapeutic Exercise and Therapeutic Activities  UBE Level 1, x 4 mins FWD, trial of BKWD at 2 min mark but increased shoulder pain so switched back to FWD which was not painful today Free Motion Machine for following: Scap Retract 7# x 12, then bil UE ext 3# x 12 returning therapist demo for each Standing with core engaged/back against  wall, and shoulders against wall and head against folded towel for neck comfort for 3 way UE raises: 1# into flex 3 x 10, scaption 2 x10, and abd 2 x10 3 way reaches with hands on wall with red therband x 5 each side Low trap lift off wall with toes away from wall x 10, toes at wall 1# x 10 but unable to keep scapula depressed so scooted away some Wall Push Ups x 10 focusing on keeping elbows adducted Doorway Pectoralis Stretch x 3 reps, 20 sec holds adjusting height until no pain felt  08/26/24 Discussion of status x 5 min UBE x 2 min some pain reported so held Rows and ext x 12 ea Blue band 3 ways raises at wall 1# x 10 ea B S/L ER and flex x 10 painful S/L horizontal ABD x 10, then with 1# x 10 Low trap lift  off 2x 10 3 way reach with light green band 2x5 B Chin tucks x 10  Ongoing discussion of posture   08/19/24: Therapeutic Exercises and Neuro Re Ed Roll yellow ball up wall with 1# on Lt wrist x 12 returning demo Standing with back against wall working on correct posture and core engagement Free Camera Operator for scap retract 3# (unable to pull 7#) x 12 reps, spending time initially focusing on instructing in correct technique and form with core engagement Standing with head/shoulders against wall and core engaged for bil UE 3 way raises 1# flex x 10, scaption x 5 (UE fatigue so stopped) and abd x 10 Therapeutic Activities  Supine for Meeks Decompression Exercises x 8 each with demo and VC's for correct muscle engagement and to relax other muscles as pt was overcompensating Ppt x 10, with 5 sec holds Self Care Spent a lot of time during session educating pt on importance of correct posture. Also incorporated this instruction into exercises with demo and examples of other times, like with ADLs, that she will want to think about incorporate these new postural tips she is learning today.     PATIENT EDUCATION: Education details: Posture education and strength, bil UE strength, and core strength to support proper posture Person educated: Patient Education method: Explanation, Demonstration, and handouts, VC's Education comprehension: verbalized understanding and returned demonstration, will benefit from further review for reinforcement  HOME EXERCISE PROGRAM: Access Code: [email or web address] URL: [email or web address] Date: 08/26/2024 Prepared by: Mliss  Exercises - Scapular Retraction with Resistance  - 1 x daily - 3 x weekly - 1-3 sets - 10 reps - 2-3 sec hold - Scapular Retraction with Resistance Advanced  - 1 x daily - 3 x weekly - 1-3 sets - 10 reps - Shoulder External Rotation and Scapular Retraction with Resistance  - 1 x daily - 3 x weekly - 1-3 sets - 10 reps - Low Trap  Setting at Wall  - 1 x daily - 3 x weekly - 2 sets - 10 reps - Standing Plank on Wall with Reaches and Resistance  - 1 x daily - 3 x weekly - 1-2 sets - 10 reps Updated HEP with pics and gave theraband  08/19/24: Posture education and strength, core strength and bil UE strength  Updated HEP and issued green theraband to progress to when ready for bil UE scap retract and UE ext, also how increasing height of anchor will increase challenge   09/02/24: Doorway Pec Stretch at 60 Elevation  - 1-2 x daily - 7 x weekly - 1 sets - 2-3 reps -  20 hold  ASSESSMENT:  CLINICAL IMPRESSION: Progressed pt with adding increased resistance with postural strength today. Also continued with standing UE strength and adjusting technique prn and VC's to remind pt when she was compensating with scapulae. She conts to be able to verbalize good understanding of corrections and reports has been incorporating these into her day and exercise class more. Progressed her HEP accordingly, see above.    OBJECTIVE IMPAIRMENTS: decreased activity tolerance, decreased knowledge of condition, decreased strength, impaired UE functional use, postural dysfunction, and pain.   ACTIVITY LIMITATIONS: lifting, reach over head, and home chores  PARTICIPATION LIMITATIONS: does what is required but with intermittent left shoulder pain  PERSONAL FACTORS: Time since onset of injury/illness/exacerbation are also affecting patient's functional outcome.   REHAB POTENTIAL: Good  CLINICAL DECISION MAKING: Stable/uncomplicated  EVALUATION COMPLEXITY: Low  GOALS: Goals reviewed with patient? Yes  SHORT TERM GOALS= LONG TERM GOALS: Target date: 09/23/2024  Pt will be independent with a HEP to improve left shoulder/scapular strength/stabilization Baseline: Goal status: INITIAL  2.  Pt will have decreased left shoulder pain by 50% overall to demonstrate improved function Baseline:  Goal status: INITIAL  3.  Pts quick dash will improve  to no greater than 15% Baseline:  Goal status: INITIAL  PT FREQUENCY: 1x/week  PT DURATION: 6 weeks  PLANNED INTERVENTIONS: 97164- PT Re-evaluation, 97750- Physical Performance Testing, 97110-Therapeutic exercises, 97530- Therapeutic activity, 97112- Neuromuscular re-education, 97535- Self Care, and [id number]- Manual therapy  PLAN FOR NEXT SESSION: Cont to review HEP and update prn; cont band exs and make sure she is contracting core/not compensating with back, thoracic extension in chair with ball, UE and postural strength, supine scapular series?, cont with patient education.   Aden Berwyn Caldron, PTA 09/02/2024, 12:02 PM   1. Decompression Exercise     Special[facility] [phone]    Lie on back on firm surface, knees bent, feet flat, arms turned up, out to sides, backs of hands down. Time _5-15__ minutes. Surface: floor   2. Head Press    Bring cervical spine (neck) into neutral position (by either tucking the chin towards the chest or tilting the chin upward). Feel weight on back of head. Press head downward into supporting surface.    Hold _2-3__ seconds. Repeat _3-5__ times. Do _1-2__ times per day.  3. Shoulder Press    Start in Decompression Exercise position. Press shoulders downward towards supporting surface. Hold __2-3__ seconds while counting out loud. Repeat _3-5___ times. Do _1-2___ times per day.   4. Leg Lengthener    Straighten one leg. Pull toes AND forefoot toward knee, extend heel. Lengthen leg by pulling pelvis away from ribs. Hold _2-3__ seconds. Relax. Repeat __4-6__ times. Do other leg.  Surface: floor   5. Leg Press    Straighten one leg down to floor keeping leg aligned with hip. Pull toes AND forefoot toward knee; extend heel.  Press entire leg downward (as if pressing leg into sandy beach). DO NOT BEND KNEE. Hold _2-3__ seconds. Do __4-6__ times. Repeat with other leg.   PELVIC TILT: Posterior    Tighten abdominals, flatten low  back. __10_ reps per set, __2-3_ sets per day, _5__ seconds hold.  3 Way Raises:      Starting Position:  Leaning against wall, walk feet a few inches away from the wall and make tummy tight (tuck hips underneath you) Press back/shoulders/head against wall as much as possible. Keep thumbs up to ceiling, elbows straight and shoulders relaxed/down throughout.  1.  Lift arms in front to shoulder height 2. Lift arms a little wider into a V to shoulder height (45 degrees) 3. Lift arms out to sides in a T to shoulder height (90 degrees)   10 reps each and start every other day for 1-2 weeks, if no increased shoulder pain then can increase to daily.

## 2024-09-08 ENCOUNTER — Encounter: Payer: Self-pay | Admitting: Radiology

## 2024-09-09 ENCOUNTER — Ambulatory Visit: Attending: Orthopaedic Surgery | Admitting: Physical Therapy

## 2024-09-09 ENCOUNTER — Encounter: Payer: Self-pay | Admitting: Physical Therapy

## 2024-09-09 DIAGNOSIS — G8929 Other chronic pain: Secondary | ICD-10-CM | POA: Diagnosis present

## 2024-09-09 DIAGNOSIS — M6281 Muscle weakness (generalized): Secondary | ICD-10-CM | POA: Insufficient documentation

## 2024-09-09 DIAGNOSIS — R293 Abnormal posture: Secondary | ICD-10-CM | POA: Insufficient documentation

## 2024-09-09 DIAGNOSIS — M25512 Pain in left shoulder: Secondary | ICD-10-CM | POA: Diagnosis present

## 2024-09-09 NOTE — Therapy (Signed)
 OUTPATIENT PHYSICAL THERAPY UPPER EXTREMITY TREATMENT   Patient Name: Shawna Bass MRN: [id number] DOB:Mar 26, 1956, 68 y.o., female Today's Date: 09/09/2024  END OF SESSION:  PT End of Session - 09/09/24 0848     Visit Number 5    Number of Visits 6    Date for Recertification  09/23/24    Authorization Type Medicare    PT Start Time 0848    PT Stop Time 0927    PT Time Calculation (min) 39 min    Activity Tolerance Patient tolerated treatment well    Behavior During Therapy [facility] for tasks assessed/performed            Past Medical History:  Diagnosis Date   BPPV (benign paroxysmal positional vertigo)    Cataract    External hemorrhoids 04/22/2019   Impingement syndrome of left shoulder 04/22/2019   2017, Dr. Melita   Osteoporosis 04/01/2020   Dexa 03/2020 T = -2.8 femur, -1.9 spine. New dx: rec OV to discuss options of treatment.    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   Impingement syndrome of left shoulder 04/22/2019   External hemorrhoids 04/22/2019    PCP:   REFERRING PROVIDER: Genelle Standing, MD  REFERRING DIAG: Left shoulder impingment  THERAPY DIAG:  Chronic left shoulder pain  Abnormal posture  Muscle weakness (generalized)  Rationale for Evaluation and Treatment: Rehabilitation  ONSET DATE: years ago with exacerbation in June 2025  SUBJECTIVE:                                                                                                                                                                                      SUBJECTIVE STATEMENT: I'm doing my exercises every other day, 2 sets. I'm back to exercising with 1#, but nothing heavier. I feel like I'm  better because I've cut my work out in half. I feel like if I just keep doing what I'm doing at home it will improve.  Hand dominance: Right  PERTINENT HISTORY: Pt has been having on and off left shoulder pain and saw Dr. Melita in 2017 for an injection with good relief. She is attending [employer] and having increased pain after using a 5 lb wt overhead. Xrays of the shoulder were normal. She is scheduled for an MRI of her left  knee to r/o meniscal tear.  PAIN:  Are you  having pain? No, just aware it's not completely gone  PRECAUTIONS: possible Left knee Meniscal tear, vertigo, osteoporosis  RED FLAGS: None   WEIGHT BEARING RESTRICTIONS: No  FALLS:  Has patient fallen in last 6 months? No  LIVING ENVIRONMENT: Lives with: lives with their spouse Lives in: House/apartment  OCCUPATION: Retired :Counseling  PLOF: Independent  PATIENT GOALS: learn what is the problem and what can she do to strengthen, have better posture etc  NEXT MD VISIT:   OBJECTIVE:  Note: Objective measures were completed at Evaluation unless otherwise noted.  DIAGNOSTIC FINDINGS:  Xrays of left shoulder normal/ left knee normal  PATIENT SURVEYS :  Quick Dash: 29.55%  COGNITION: Overall cognitive status: Within functional limits for tasks assessed     SENSATION: WFL  POSTURE: increased thoracic kyphosis and forward head posture   UPPER EXTREMITY ROM: + = pain  Active ROM Right eval Left eval Left  09/09/24  Shoulder flexion 145 152   Shoulder extension     Shoulder abduction 160 165+   Shoulder adduction     Shoulder internal rotation T7 T7   Shoulder external rotation Scap spine Scap spine   Elbow flexion     Elbow extension     Wrist flexion     Wrist extension     Wrist ulnar deviation     Wrist radial deviation     Wrist pronation     Wrist supination     (Blank rows = not tested)  Functional RoM flex 145 deg, ABD 162 no pain  UPPER EXTREMITY MMT:  MMT Right eval  Left eval Left  09/09/24  Shoulder flexion  4+ 5  Shoulder extension   4+  Shoulder abduction  4    +pain 4+ mild pain  Shoulder adduction     Shoulder internal rotation  5 5  Shoulder external rotation  4+ 5-  Middle trapezius   5-  Lower trapezius   3+  Elbow flexion  5 5  Elbow extension     Wrist flexion     Wrist extension     Wrist ulnar deviation     Wrist radial deviation     Wrist pronation     Wrist supination     Grip strength (lbs)     (Blank rows = not tested)  SHOULDER SPECIAL TESTS: Impingement tests: Hawkins/Kennedy impingement test: positive  Rotator cuff assessment: Empty can test: positive   Pt demonstrates UT compensation on left with overhead activities    PALPATION:  Non tender                                                                                                                             TREATMENT DATE:  09/09/24 UBE L2 x 4 min some fwd 3 ways raises 2# x 10 ea L 3 way reach with light green band 2x5 B Prone T 2# 2 x 10 Prone Y 1# 2 x 10 Prone row  5# 2x10 Prone row at 90 deg ABD 2# 2x10 Scap retraction with ER beyond 45 deg  2x10 red Discussion of when and how to progress weights and reps with exercise and touse pain as a guide as well.   09/02/24: Therapeutic Exercise and Therapeutic Activities  UBE Level 1, x 4 mins FWD, trial of BKWD at 2 min mark but increased shoulder pain so switched back to FWD which was not painful today Free Motion Machine for following: Scap Retract 7# x 12, then bil UE ext 3# x 12 returning therapist demo for each Standing with core engaged/back against wall, and shoulders against wall and head against folded towel for neck comfort for 3 way UE raises: 1# into flex 3 x 10, scaption 2 x10, and abd 2 x10 3 way reaches with hands on wall with red therband x 5 each side Low trap lift off wall with toes away from wall x 10, toes at wall 1# x 10 but unable to keep scapula depressed so scooted away some Wall  Push Ups x 10 focusing on keeping elbows adducted Doorway Pectoralis Stretch x 3 reps, 20 sec holds adjusting height until no pain felt  08/26/24 Discussion of status x 5 min UBE x 2 min some pain reported so held Rows and ext x 12 ea Blue band 3 ways raises at wall 1# x 10 ea B S/L ER and flex x 10 painful S/L horizontal ABD x 10, then with 1# x 10 Low trap lift off 2x 10 3 way reach with light green band 2x5 B Chin tucks x 10  Ongoing discussion of posture   08/19/24: Therapeutic Exercises and Neuro Re Ed Roll yellow ball up wall with 1# on Lt wrist x 12 returning demo Standing with back against wall working on correct posture and core engagement Free Camera Operator for scap retract 3# (unable to pull 7#) x 12 reps, spending time initially focusing on instructing in correct technique and form with core engagement Standing with head/shoulders against wall and core engaged for bil UE 3 way raises 1# flex x 10, scaption x 5 (UE fatigue so stopped) and abd x 10 Therapeutic Activities  Supine for Meeks Decompression Exercises x 8 each with demo and VC's for correct muscle engagement and to relax other muscles as pt was overcompensating Ppt x 10, with 5 sec holds Self Care Spent a lot of time during session educating pt on importance of correct posture. Also incorporated this instruction into exercises with demo and examples of other times, like with ADLs, that she will want to think about incorporate these new postural tips she is learning today.     PATIENT EDUCATION: Education details: Posture education and strength, bil UE strength, and core strength to support proper posture Person educated: Patient Education method: Explanation, Demonstration, and handouts, VC's Education comprehension: verbalized understanding and returned demonstration, will benefit from further review for reinforcement  HOME EXERCISE PROGRAM: Access Code: [email or web address] URL:  [email or web address] Date: 09/09/2024 Prepared by: Mliss  Exercises - Scapular Retraction with Resistance  - 1 x daily - 3 x weekly - 1-3 sets - 10 reps - 2-3 sec hold - Scapular Retraction with Resistance Advanced  - 1 x daily - 3 x weekly - 1-3 sets - 10 reps - Shoulder External Rotation and Scapular Retraction with Resistance  - 1 x daily - 3 x weekly - 1-3 sets - 10 reps - Low Trap Setting at Wall  - 1 x daily - 3 x  weekly - 2 sets - 10 reps - Standing Plank on Wall with Reaches and Resistance  - 1 x daily - 3 x weekly - 1-2 sets - 10 reps - Doorway Pec Stretch at 60 Elevation  - 1-2 x daily - 7 x weekly - 1 sets - 2-3 reps - 20 hold - Prone Single Arm Shoulder Horizontal Abduction with Dumbbell - Palm Down  - 1 x daily - 3 x weekly - 1-3 sets - 10 reps - Prone Single Arm Shoulder Y with Dumbbell  - 1 x daily - 3 x weekly - 1-3 sets - 10 reps - Prone Shoulder Row (Mirrored)  - 1 x daily - 3 x weekly - 1-3 sets - 10 reps - Prone Shoulder Row in Abduction  - 1 x daily - 3 x weekly - 1-3 sets - 10 reps - Standing Shoulder Flexion to 90 Degrees  - 1 x daily - 3 x weekly - 1-3 sets - 10 reps - Standing Shoulder Scaption  - 1 x daily - 3 x weekly - 1-3 sets - 10 reps - Shoulder Abduction - Thumbs Up  - 1 x daily - 3 x weekly - 1-3 sets - 10 reps  ASSESSMENT:  CLINICAL IMPRESSION: Patient presents today considering d/c  After assessment of strength and introducing new TE to address her remaining deficits, she determined she would like to come at least one more visit. She has significant low trap weakness, so HEP progressed to address this and general posterior shoulder strength. She has met her pain goal. Likely will be ready for d/c at next visit.   OBJECTIVE IMPAIRMENTS: decreased activity tolerance, decreased knowledge of condition, decreased strength, impaired UE functional use, postural dysfunction, and pain.   ACTIVITY LIMITATIONS: lifting, reach over head, and home  chores  PARTICIPATION LIMITATIONS: does what is required but with intermittent left shoulder pain  PERSONAL FACTORS: Time since onset of injury/illness/exacerbation are also affecting patient's functional outcome.   REHAB POTENTIAL: Good  CLINICAL DECISION MAKING: Stable/uncomplicated  EVALUATION COMPLEXITY: Low  GOALS: Goals reviewed with patient? Yes  SHORT TERM GOALS= LONG TERM GOALS: Target date: 09/23/2024  Pt will be independent with a HEP to improve left shoulder/scapular strength/stabilization Baseline: Goal status: IN PROGRESS  2.  Pt will have decreased left shoulder pain by 50% overall to demonstrate improved function Baseline:  Goal status: MET 50% at least 11/4  3.  Pts quick dash will improve to no greater than 15% Baseline:  Goal status: INITIAL  PT FREQUENCY: 1x/week  PT DURATION: 6 weeks  PLANNED INTERVENTIONS: 97164- PT Re-evaluation, 97750- Physical Performance Testing, 97110-Therapeutic exercises, 97530- Therapeutic activity, [id number]- Neuromuscular re-education, 97535- Self Care, and [id number]- Manual therapy  PLAN FOR NEXT SESSION: assess response to post shoulder exercises. Junie Palin,  D/C or renew.    Eilan Mcinerny, PT 09/09/2024, 1:22 PM   1. Decompression Exercise     [facility] [phone]    Lie on back on firm surface, knees bent, feet flat, arms turned up, out to sides, backs of hands down. Time _5-15__ minutes. Surface: floor   2. Head Press    Bring cervical spine (neck) into neutral position (by either tucking the chin towards the chest or tilting the chin upward). Feel weight on back of head. Press head downward into supporting surface.    Hold _2-3__ seconds. Repeat _3-5__ times. Do _1-2__ times per day.  3. Shoulder Press    Start in Decompression Exercise position. Press shoulders downward towards supporting  surface. Hold __2-3__ seconds while counting out loud. Repeat _3-5___ times. Do _1-2___ times per day.   4. Leg  Lengthener    Straighten one leg. Pull toes AND forefoot toward knee, extend heel. Lengthen leg by pulling pelvis away from ribs. Hold _2-3__ seconds. Relax. Repeat __4-6__ times. Do other leg.  Surface: floor   5. Leg Press    Straighten one leg down to floor keeping leg aligned with hip. Pull toes AND forefoot toward knee; extend heel.  Press entire leg downward (as if pressing leg into sandy beach). DO NOT BEND KNEE. Hold _2-3__ seconds. Do __4-6__ times. Repeat with other leg.   PELVIC TILT: Posterior    Tighten abdominals, flatten low back. __10_ reps per set, __2-3_ sets per day, _5__ seconds hold.  3 Way Raises:      Starting Position:  Leaning against wall, walk feet a few inches away from the wall and make tummy tight (tuck hips underneath you) Press back/shoulders/head against wall as much as possible. Keep thumbs up to ceiling, elbows straight and shoulders relaxed/down throughout.  1. Lift arms in front to shoulder height 2. Lift arms a little wider into a V to shoulder height (45 degrees) 3. Lift arms out to sides in a T to shoulder height (90 degrees)   10 reps each and start every other day for 1-2 weeks, if no increased shoulder pain then can increase to daily.

## 2024-09-16 ENCOUNTER — Ambulatory Visit

## 2024-09-16 NOTE — Therapy (Signed)
 OUTPATIENT PHYSICAL THERAPY UPPER EXTREMITY TREATMENT AND DISCHARGE SUMMARY   Patient Name: Shawna Bass MRN: [id number] DOB:November 12, 1955, 68 y.o., female Today's Date: 09/17/2024  END OF SESSION:  PT End of Session - 09/17/24 0802     Visit Number 6    Number of Visits 6    Date for Recertification  09/23/24    Authorization Type Medicare    PT Start Time 0801    PT Stop Time 0841    PT Time Calculation (min) 40 min    Activity Tolerance Patient tolerated treatment well    Behavior During Therapy [facility] for tasks assessed/performed             Past Medical History:  Diagnosis Date   BPPV (benign paroxysmal positional vertigo)    Cataract    External hemorrhoids 04/22/2019   Impingement syndrome of left shoulder 04/22/2019   2017, Dr. Melita   Osteoporosis 04/01/2020   Dexa 03/2020 T = -2.8 femur, -1.9 spine. New dx: rec OV to discuss options of treatment.    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   Impingement syndrome of left shoulder 04/22/2019   External hemorrhoids 04/22/2019    PCP:   REFERRING PROVIDER: Genelle Standing, MD  REFERRING DIAG: Left shoulder impingment  THERAPY DIAG:  Chronic left shoulder pain  Abnormal posture  Muscle weakness (generalized)  Rationale for Evaluation and Treatment: Rehabilitation  ONSET DATE: years ago with exacerbation in June 2025  SUBJECTIVE:                                                                                                                                                                                      SUBJECTIVE STATEMENT: I did my whole class the other day with one pound weights. It's getting better.   Hand  dominance: Right  PERTINENT HISTORY: Pt has been having on and off left shoulder pain and saw Dr. Melita in 2017 for an injection with good relief. She is attending [employer] and having increased pain after using a 5 lb wt overhead. Xrays of the shoulder were normal. She is scheduled for an MRI of her left  knee to r/o meniscal tear.  PAIN:  Are you having pain? No, just aware it's not completely gone  PRECAUTIONS: possible Left knee Meniscal tear, vertigo, osteoporosis  RED FLAGS: None   WEIGHT BEARING RESTRICTIONS:  No  FALLS:  Has patient fallen in last 6 months? No  LIVING ENVIRONMENT: Lives with: lives with their spouse Lives in: House/apartment  OCCUPATION: Retired :Counseling  PLOF: Independent  PATIENT GOALS: learn what is the problem and what can she do to strengthen, have better posture etc  NEXT MD VISIT:   OBJECTIVE:  Note: Objective measures were completed at Evaluation unless otherwise noted.  DIAGNOSTIC FINDINGS:  Xrays of left shoulder normal/ left knee normal  PATIENT SURVEYS :  Quick Dash: 29.55% 09/17/24  Quick Dash 6.8 / 100 = 6.8 %  COGNITION: Overall cognitive status: Within functional limits for tasks assessed     SENSATION: WFL  POSTURE: increased thoracic kyphosis and forward head posture   UPPER EXTREMITY ROM: + = pain  Active ROM Right eval Left eval Left  09/17/24 Right 11/12  Shoulder flexion 145 152 163 155  Shoulder extension      Shoulder abduction 160 165+ 170 180  Shoulder adduction      Shoulder internal rotation T7 T7    Shoulder external rotation Scap spine Scap spine    Elbow flexion      Elbow extension      Wrist flexion      Wrist extension      Wrist ulnar deviation      Wrist radial deviation      Wrist pronation      Wrist supination      (Blank rows = not tested)  Functional RoM flex 145 deg, ABD 162 no pain  UPPER EXTREMITY MMT:  MMT Right eval Left eval Left  09/09/24  Shoulder flexion  4+ 5   Shoulder extension   4+  Shoulder abduction  4    +pain 4+ mild pain  Shoulder adduction     Shoulder internal rotation  5 5  Shoulder external rotation  4+ 5-  Middle trapezius   5-  Lower trapezius   3+  Elbow flexion  5 5  Elbow extension     Wrist flexion     Wrist extension     Wrist ulnar deviation     Wrist radial deviation     Wrist pronation     Wrist supination     Grip strength (lbs)     (Blank rows = not tested)  SHOULDER SPECIAL TESTS: Impingement tests: Hawkins/Kennedy impingement test: positive  Rotator cuff assessment: Empty can test: positive   Pt demonstrates UT compensation on left with overhead activities    PALPATION:  Non tender                                                                                                                             TREATMENT DATE:  09/17/24 UBE L2 x 4 min fwd Quick dash completed ROM measured 3 ways raises 2# x 10 ea L 3 way reach with blue band x 10 B Prone T 2# 2 x 10 Prone Y 2# 2 x  10 Prone row 5# 2x10 Prone row at 90 deg ABD 2# 2x10 Standing scap retraction with ER beyond 45 deg  2x10 red   09/09/24 UBE L2 x 4 min some fwd 3 ways raises 2# x 10 ea L 3 way reach with light green band 2x5 B Prone T 2# 2 x 10 Prone Y 1# 2 x 10 Prone row 5# 2x10 Prone row at 90 deg ABD 2# 2x10 Scap retraction with ER beyond 45 deg  2x10 red Discussion of when and how to progress weights and reps with exercise and touse pain as a guide as well.   09/02/24: Therapeutic Exercise and Therapeutic Activities  UBE Level 1, x 4 mins FWD, trial of BKWD at 2 min mark but increased shoulder pain so switched back to FWD which was not painful today Free Motion Machine for following: Scap Retract 7# x 12, then bil UE ext 3# x 12 returning therapist demo for each Standing with core engaged/back against wall, and shoulders against wall and head against folded towel for neck comfort for 3 way UE raises: 1# into flex 3 x 10, scaption  2 x10, and abd 2 x10 3 way reaches with hands on wall with red therband x 5 each side Low trap lift off wall with toes away from wall x 10, toes at wall 1# x 10 but unable to keep scapula depressed so scooted away some Wall Push Ups x 10 focusing on keeping elbows adducted Doorway Pectoralis Stretch x 3 reps, 20 sec holds adjusting height until no pain felt  08/26/24 Discussion of status x 5 min UBE x 2 min some pain reported so held Rows and ext x 12 ea Blue band 3 ways raises at wall 1# x 10 ea B S/L ER and flex x 10 painful S/L horizontal ABD x 10, then with 1# x 10 Low trap lift off 2x 10 3 way reach with light green band 2x5 B Chin tucks x 10  Ongoing discussion of posture   08/19/24: Therapeutic Exercises and Neuro Re Ed Roll yellow ball up wall with 1# on Lt wrist x 12 returning demo Standing with back against wall working on correct posture and core engagement Free Camera Operator for scap retract 3# (unable to pull 7#) x 12 reps, spending time initially focusing on instructing in correct technique and form with core engagement Standing with head/shoulders against wall and core engaged for bil UE 3 way raises 1# flex x 10, scaption x 5 (UE fatigue so stopped) and abd x 10 Therapeutic Activities  Supine for Meeks Decompression Exercises x 8 each with demo and VC's for correct muscle engagement and to relax other muscles as pt was overcompensating Ppt x 10, with 5 sec holds Self Care Spent a lot of time during session educating pt on importance of correct posture. Also incorporated this instruction into exercises with demo and examples of other times, like with ADLs, that she will want to think about incorporate these new postural tips she is learning today.     PATIENT EDUCATION: Education details: Posture education and strength, bil UE strength, and core strength to support proper posture Person educated: Patient Education method: Explanation, Demonstration, and handouts,  VC's Education comprehension: verbalized understanding and returned demonstration, will benefit from further review for reinforcement  HOME EXERCISE PROGRAM: Access Code: [email or web address] URL: [email or web address] Date: 09/09/2024 Prepared by: Mliss  Exercises - Scapular Retraction with Resistance  - 1 x daily - 3 x weekly - 1-3 sets -  10 reps - 2-3 sec hold - Scapular Retraction with Resistance Advanced  - 1 x daily - 3 x weekly - 1-3 sets - 10 reps - Shoulder External Rotation and Scapular Retraction with Resistance  - 1 x daily - 3 x weekly - 1-3 sets - 10 reps - Low Trap Setting at Wall  - 1 x daily - 3 x weekly - 2 sets - 10 reps - Standing Plank on Wall with Reaches and Resistance  - 1 x daily - 3 x weekly - 1-2 sets - 10 reps - Doorway Pec Stretch at 60 Elevation  - 1-2 x daily - 7 x weekly - 1 sets - 2-3 reps - 20 hold - Prone Single Arm Shoulder Horizontal Abduction with Dumbbell - Palm Down  - 1 x daily - 3 x weekly - 1-3 sets - 10 reps - Prone Single Arm Shoulder Y with Dumbbell  - 1 x daily - 3 x weekly - 1-3 sets - 10 reps - Prone Shoulder Row (Mirrored)  - 1 x daily - 3 x weekly - 1-3 sets - 10 reps - Prone Shoulder Row in Abduction  - 1 x daily - 3 x weekly - 1-3 sets - 10 reps - Standing Shoulder Flexion to 90 Degrees  - 1 x daily - 3 x weekly - 1-3 sets - 10 reps - Standing Shoulder Scaption  - 1 x daily - 3 x weekly - 1-3 sets - 10 reps - Shoulder Abduction - Thumbs Up  - 1 x daily - 3 x weekly - 1-3 sets - 10 reps  ASSESSMENT:  CLINICAL IMPRESSION: Jearld Christobal Jester has returned to doing her full exercise class without increased pain. Her strength is improving and pain is mild at this time. She feels it with repetitive movements. She does not feel pain when she picks up a gallon of milk or close her car door which previously hurt. She is compliant with her HEP and should continue to improve. Her shoulder ROM has increased B and no pain is reported. Patient is  pleased with her current level of function and agrees to discharge.    OBJECTIVE IMPAIRMENTS: decreased activity tolerance, decreased knowledge of condition, decreased strength, impaired UE functional use, postural dysfunction, and pain.   ACTIVITY LIMITATIONS: lifting, reach over head, and home chores  PARTICIPATION LIMITATIONS: does what is required but with intermittent left shoulder pain  PERSONAL FACTORS: Time since onset of injury/illness/exacerbation are also affecting patient's functional outcome.   REHAB POTENTIAL: Good  CLINICAL DECISION MAKING: Stable/uncomplicated  EVALUATION COMPLEXITY: Low  GOALS: Goals reviewed with patient? Yes  SHORT TERM GOALS= LONG TERM GOALS: Target date: 09/23/2024  Pt will be independent with a HEP to improve left shoulder/scapular strength/stabilization Baseline: Goal status: MET  2.  Pt will have decreased left shoulder pain by 50% overall to demonstrate improved function Baseline:  Goal status: MET 50% at least 11/4  3.  Pts quick dash will improve to no greater than 15% Baseline:  Goal status: MET 09/17/24 6.8 / 100 = 6.8 %  PT FREQUENCY: 1x/week  PT DURATION: 6 weeks  PLANNED INTERVENTIONS: 97164- PT Re-evaluation, 97750- Physical Performance Testing, 97110-Therapeutic exercises, 97530- Therapeutic activity, 97112- Neuromuscular re-education, 97535- Self Care, and 02859- Manual therapy  PLAN FOR NEXT SESSION: assess response to post shoulder exercises. Junie Palin,  D/C or renew.    Edmund Holcomb, PT 09/17/2024, 8:44 AM   1. Decompression Exercise     [facility] [phone]  Lie on back on firm surface, knees bent, feet flat, arms turned up, out to sides, backs of hands down. Time _5-15__ minutes. Surface: floor   2. Head Press    Bring cervical spine (neck) into neutral position (by either tucking the chin towards the chest or tilting the chin upward). Feel weight on back of head. Press head downward into  supporting surface.    Hold _2-3__ seconds. Repeat _3-5__ times. Do _1-2__ times per day.  3. Shoulder Press    Start in Decompression Exercise position. Press shoulders downward towards supporting surface. Hold __2-3__ seconds while counting out loud. Repeat _3-5___ times. Do _1-2___ times per day.   4. Leg Lengthener    Straighten one leg. Pull toes AND forefoot toward knee, extend heel. Lengthen leg by pulling pelvis away from ribs. Hold _2-3__ seconds. Relax. Repeat __4-6__ times. Do other leg.  Surface: floor   5. Leg Press    Straighten one leg down to floor keeping leg aligned with hip. Pull toes AND forefoot toward knee; extend heel.  Press entire leg downward (as if pressing leg into sandy beach). DO NOT BEND KNEE. Hold _2-3__ seconds. Do __4-6__ times. Repeat with other leg.   PELVIC TILT: Posterior    Tighten abdominals, flatten low back. __10_ reps per set, __2-3_ sets per day, _5__ seconds hold.  3 Way Raises:      Starting Position:  Leaning against wall, walk feet a few inches away from the wall and make tummy tight (tuck hips underneath you) Press back/shoulders/head against wall as much as possible. Keep thumbs up to ceiling, elbows straight and shoulders relaxed/down throughout.  1. Lift arms in front to shoulder height 2. Lift arms a little wider into a V to shoulder height (45 degrees) 3. Lift arms out to sides in a T to shoulder height (90 degrees)   10 reps each and start every other day for 1-2 weeks, if no increased shoulder pain then can increase to daily.    PHYSICAL THERAPY DISCHARGE SUMMARY  Visits from Start of Care: 6  Current functional level related to goals / functional outcomes: See above clinical impression.    Remaining deficits: See above clinical impression.    Education / Equipment: HEP   Patient agrees to discharge. Patient goals were met. Patient is being discharged due to meeting the stated rehab  goals.   Mliss Cummins, PT 09/17/24 8:44 AM [facility] [address], [address] [place], [place] [address] Phone # [phone] Fax [phone]

## 2024-09-17 ENCOUNTER — Ambulatory Visit (INDEPENDENT_AMBULATORY_CARE_PROVIDER_SITE_OTHER): Admitting: Orthopaedic Surgery

## 2024-09-17 ENCOUNTER — Encounter: Payer: Self-pay | Admitting: Physical Therapy

## 2024-09-17 ENCOUNTER — Ambulatory Visit: Admitting: Physical Therapy

## 2024-09-17 DIAGNOSIS — M25562 Pain in left knee: Secondary | ICD-10-CM

## 2024-09-17 DIAGNOSIS — G8929 Other chronic pain: Secondary | ICD-10-CM

## 2024-09-17 DIAGNOSIS — R293 Abnormal posture: Secondary | ICD-10-CM

## 2024-09-17 DIAGNOSIS — M25512 Pain in left shoulder: Secondary | ICD-10-CM | POA: Diagnosis not present

## 2024-09-17 DIAGNOSIS — M6281 Muscle weakness (generalized): Secondary | ICD-10-CM

## 2024-09-17 NOTE — Progress Notes (Signed)
 Chief Complaint: Left knee, left shoulder pain     History of Present Illness:   09/17/2024: Presents today for follow-up of the left knee and here today for MRI discussion  Shawna Bass is a 68 y.o. female right-hand-dominant female presents with ongoing left shoulder pain.  She has been having on and off shoulder pain and did see Dr. Melita in 2017 for which a subacromial injection was provided.  She did get very good relief from this.  She has been increasingly having overhead pain for reach particularly as she has been ramping up with a 5 pound weight in her exercise class downstairs at [employer].  With regard to the left knee she has been having chronic pain for approximately 35 years although this has worsened in June.  She has pain with any type of pivoting or lateral type motion.  She is not having pain on the stairs.  She is very active.  She is experiencing pain predominately about the medial joint line about the left knee.  She does enjoy traveling as well    PMH/PSH/Family History/Social History/Meds/Allergies:    Past Medical History:  Diagnosis Date   BPPV (benign paroxysmal positional vertigo)    Cataract    External hemorrhoids 04/22/2019   Impingement syndrome of left shoulder 04/22/2019   2017, Dr. Melita   Osteoporosis 04/01/2020   Dexa 03/2020 T = -2.8 femur, -1.9 spine. New dx: rec OV to discuss options of treatment.    Past Surgical History:  Procedure Laterality Date   NO PAST SURGERIES     Social History   Socioeconomic History   Marital status: Married    Spouse name: Not on file   Number of children: Not on file   Years of education: Not on file   Highest education level: Not on file  Occupational History   Not on file  Tobacco Use   Smoking status: Never   Smokeless tobacco: Never  Vaping Use   Vaping status: Never Used  Substance and Sexual Activity   Alcohol use: Yes    Alcohol/week: 8.0 - 10.0 standard drinks of alcohol     Types: 8 - 10 Standard drinks or equivalent per week   Drug use: No   Sexual activity: Not on file  Other Topics Concern   Not on file  Social History Narrative   Caffiene 1-2 cups coffee   No sodas   Education masters degre   Working retired: counseling.   Social Drivers of Corporate Investment Banker Strain: Not on file  Food Insecurity: Not on file  Transportation Needs: Not on file  Physical Activity: Not on file  Stress: Not on file  Social Connections: Unknown (03/21/2022)   [facility]   Social Network    Social Network: Not on file   Family History  Problem Relation Age of Onset   Stroke Mother    Heart disease Father    Throat cancer Father    Healthy Daughter    Healthy Son    Breast cancer Neg Hx    BRCA 1/2 Neg Hx    Allergies  Allergen Reactions   Sulfa Antibiotics    Current Outpatient Medications  Medication Sig Dispense Refill   Calcium 280 MG TABS Take one table 3 x week     Multiple Vitamin (MULTIVITAMIN) tablet Take by mouth.     Omega-3 Fatty Acids (FISH OIL) 1200 MG CAPS Take 1 capsule by mouth daily.  Polyethylene Glycol 3350 (MIRALAX PO) Take by mouth daily.      No current facility-administered medications for this visit.   No results found.  Review of Systems:   A ROS was performed including pertinent positives and negatives as documented in the HPI.  Physical Exam :   Constitutional: NAD and appears stated age Neurological: Alert and oriented Psych: Appropriate affect and cooperative There were no vitals taken for this visit.   Comprehensive Musculoskeletal Exam:    Left knee with tenderness about medial joint line with positive McMurray.  Range of motion is from -30 to 135 degrees.  No lateral joint line tenderness, negative Lachman no varus or valgus laxity distal neurosensory intact  Left shoulder with lateral radiating pain and positive Neer impingement.  Active forward elevation is 160 degrees bilaterally  external rotation at side is 40 degrees bilaterally internal rotation is L1 bilaterally.   Imaging:   Xray (3 views left shoulder, 4 views left knee): Normal left shoulder, normal left knee  MRI left knee: Nearly full-thickness chondral loss patellofemoral joint otherwise normal MRI  I personally reviewed and interpreted the radiographs.   Assessment and Plan:   68 y.o. female with evidence of left knee pain consistent with isolated patellofemoral osteoarthritis which overall is feeling better today's visit.  I did discuss that we could discuss or pursue a left knee intra-articular steroid injection in the future which she would like to defer today.  I do believe this is reasonable she will see us  back as needed    I personally saw and evaluated the patient, and participated in the management and treatment plan.  Elspeth Parker, MD Attending Physician, Orthopedic Surgery  This document was dictated using Dragon voice recognition software. A reasonable attempt at proof reading has been made to minimize errors.
